# Patient Record
Sex: Male | Born: 1950 | Race: White | Hispanic: No | Marital: Married | Smoking: Former smoker
Health system: Southern US, Community
[De-identification: ages and names within clinical notes are randomized; demographics above are authoritative.]

## PROBLEM LIST (undated history)

## (undated) DIAGNOSIS — F32A Depression, unspecified: Secondary | ICD-10-CM

## (undated) DIAGNOSIS — R519 Headache, unspecified: Secondary | ICD-10-CM

## (undated) DIAGNOSIS — I10 Essential (primary) hypertension: Secondary | ICD-10-CM

## (undated) DIAGNOSIS — Z87442 Personal history of urinary calculi: Secondary | ICD-10-CM

## (undated) DIAGNOSIS — R51 Headache: Secondary | ICD-10-CM

## (undated) DIAGNOSIS — M1990 Unspecified osteoarthritis, unspecified site: Secondary | ICD-10-CM

## (undated) DIAGNOSIS — F329 Major depressive disorder, single episode, unspecified: Secondary | ICD-10-CM

## (undated) DIAGNOSIS — M779 Enthesopathy, unspecified: Secondary | ICD-10-CM

## (undated) DIAGNOSIS — E119 Type 2 diabetes mellitus without complications: Secondary | ICD-10-CM

## (undated) HISTORY — PX: HAND SURGERY: SHX662

## (undated) HISTORY — PX: WISDOM TOOTH EXTRACTION: SHX21

## (undated) HISTORY — PX: CYSTOSCOPY PROSTATE W/ LASER: SUR372

## (undated) HISTORY — PX: TOOTH EXTRACTION: SUR596

## (undated) HISTORY — PX: NECK SURGERY: SHX720

---

## 2011-02-12 HISTORY — PX: COLONOSCOPY: SHX174

## 2015-06-01 ENCOUNTER — Encounter: Payer: Self-pay | Admitting: *Deleted

## 2015-06-05 NOTE — Discharge Instructions (Signed)

## 2015-06-06 ENCOUNTER — Ambulatory Visit: Payer: Managed Care, Other (non HMO) | Admitting: Anesthesiology

## 2015-06-06 ENCOUNTER — Ambulatory Visit
Admission: RE | Admit: 2015-06-06 | Discharge: 2015-06-06 | Disposition: A | Discharge status: Home / Self Care | Payer: Managed Care, Other (non HMO) | Source: Ambulatory Visit | Attending: Gastroenterology | Admitting: Gastroenterology

## 2015-06-06 ENCOUNTER — Encounter
Admission: RE | Disposition: A | Discharge status: Home / Self Care | Payer: Self-pay | Source: Ambulatory Visit | Attending: Gastroenterology

## 2015-06-06 DIAGNOSIS — F329 Major depressive disorder, single episode, unspecified: Secondary | ICD-10-CM | POA: Insufficient documentation

## 2015-06-06 DIAGNOSIS — M1990 Unspecified osteoarthritis, unspecified site: Secondary | ICD-10-CM | POA: Diagnosis not present

## 2015-06-06 DIAGNOSIS — Z87891 Personal history of nicotine dependence: Secondary | ICD-10-CM | POA: Insufficient documentation

## 2015-06-06 DIAGNOSIS — I10 Essential (primary) hypertension: Secondary | ICD-10-CM | POA: Insufficient documentation

## 2015-06-06 DIAGNOSIS — D124 Benign neoplasm of descending colon: Secondary | ICD-10-CM | POA: Diagnosis not present

## 2015-06-06 DIAGNOSIS — E119 Type 2 diabetes mellitus without complications: Secondary | ICD-10-CM | POA: Diagnosis not present

## 2015-06-06 DIAGNOSIS — G43909 Migraine, unspecified, not intractable, without status migrainosus: Secondary | ICD-10-CM | POA: Diagnosis not present

## 2015-06-06 DIAGNOSIS — K5730 Diverticulosis of large intestine without perforation or abscess without bleeding: Secondary | ICD-10-CM | POA: Insufficient documentation

## 2015-06-06 DIAGNOSIS — Z886 Allergy status to analgesic agent status: Secondary | ICD-10-CM | POA: Insufficient documentation

## 2015-06-06 DIAGNOSIS — Z79899 Other long term (current) drug therapy: Secondary | ICD-10-CM | POA: Insufficient documentation

## 2015-06-06 DIAGNOSIS — D122 Benign neoplasm of ascending colon: Secondary | ICD-10-CM | POA: Insufficient documentation

## 2015-06-06 DIAGNOSIS — Z7984 Long term (current) use of oral hypoglycemic drugs: Secondary | ICD-10-CM | POA: Diagnosis not present

## 2015-06-06 DIAGNOSIS — Z1211 Encounter for screening for malignant neoplasm of colon: Secondary | ICD-10-CM | POA: Diagnosis present

## 2015-06-06 HISTORY — DX: Headache: R51

## 2015-06-06 HISTORY — DX: Essential (primary) hypertension: I10

## 2015-06-06 HISTORY — DX: Type 2 diabetes mellitus without complications: E11.9

## 2015-06-06 HISTORY — PX: POLYPECTOMY: SHX5525

## 2015-06-06 HISTORY — DX: Enthesopathy, unspecified: M77.9

## 2015-06-06 HISTORY — DX: Major depressive disorder, single episode, unspecified: F32.9

## 2015-06-06 HISTORY — DX: Headache, unspecified: R51.9

## 2015-06-06 HISTORY — PX: COLONOSCOPY: SHX5424

## 2015-06-06 HISTORY — DX: Depression, unspecified: F32.A

## 2015-06-06 HISTORY — DX: Unspecified osteoarthritis, unspecified site: M19.90

## 2015-06-06 HISTORY — DX: Personal history of urinary calculi: Z87.442

## 2015-06-06 LAB — GLUCOSE, CAPILLARY
GLUCOSE-CAPILLARY: 109 mg/dL — AB (ref 65–99)
Glucose-Capillary: 101 mg/dL — ABNORMAL HIGH (ref 65–99)

## 2015-06-06 SURGERY — COLONOSCOPY
Anesthesia: Monitor Anesthesia Care | Wound class: Contaminated

## 2015-06-06 MED ORDER — STERILE WATER FOR IRRIGATION IR SOLN
Status: DC | PRN
  Administered 2015-06-06: 11:00:00

## 2015-06-06 MED ORDER — LACTATED RINGERS IV SOLN
INTRAVENOUS | Status: DC
  Administered 2015-06-06: 09:00:00 via INTRAVENOUS

## 2015-06-06 MED ORDER — PROPOFOL 10 MG/ML IV BOLUS
INTRAVENOUS | Status: DC | PRN
  Administered 2015-06-06: 20 mg via INTRAVENOUS
  Administered 2015-06-06: 10 mg via INTRAVENOUS
  Administered 2015-06-06: 30 mg via INTRAVENOUS
  Administered 2015-06-06: 20 mg via INTRAVENOUS
  Administered 2015-06-06 (×2): 30 mg via INTRAVENOUS
  Administered 2015-06-06: 20 mg via INTRAVENOUS
  Administered 2015-06-06: 70 mg via INTRAVENOUS

## 2015-06-06 MED ORDER — LIDOCAINE HCL (CARDIAC) 20 MG/ML IV SOLN
INTRAVENOUS | Status: DC | PRN
  Administered 2015-06-06: 40 mg via INTRAVENOUS

## 2015-06-06 SURGICAL SUPPLY — 30 items
CANISTER SUCT 1200ML W/VALVE (MISCELLANEOUS) ×4 IMPLANT
FCP ESCP3.2XJMB 240X2.8X (MISCELLANEOUS)
FORCEPS BIOP RAD 4 LRG CAP 4 (CUTTING FORCEPS) ×4 IMPLANT
FORCEPS BIOP RJ4 240 W/NDL (MISCELLANEOUS)
FORCEPS ESCP3.2XJMB 240X2.8X (MISCELLANEOUS) IMPLANT
GOWN CVR UNV OPN BCK APRN NK (MISCELLANEOUS) ×2 IMPLANT
GOWN ISOL THUMB LOOP REG UNIV (MISCELLANEOUS) ×2
GOWN STRL REUS W/ TWL LRG LVL3 (GOWN DISPOSABLE) ×2 IMPLANT
GOWN STRL REUS W/TWL LRG LVL3 (GOWN DISPOSABLE) ×2
HEMOCLIP INSTINCT (CLIP) IMPLANT
INJECTOR VARIJECT VIN23 (MISCELLANEOUS) IMPLANT
KIT CO2 TUBING (TUBING) IMPLANT
KIT DEFENDO VALVE AND CONN (KITS) IMPLANT
KIT ENDO PROCEDURE OLY (KITS) ×4 IMPLANT
LIGATOR MULTIBAND 6SHOOTER MBL (MISCELLANEOUS) IMPLANT
MARKER SPOT ENDO TATTOO 5ML (MISCELLANEOUS) IMPLANT
PAD GROUND ADULT SPLIT (MISCELLANEOUS) IMPLANT
SNARE SHORT THROW 13M SML OVAL (MISCELLANEOUS) IMPLANT
SNARE SHORT THROW 30M LRG OVAL (MISCELLANEOUS) IMPLANT
SPOT EX ENDOSCOPIC TATTOO (MISCELLANEOUS)
SUCTION POLY TRAP 4CHAMBER (MISCELLANEOUS) IMPLANT
TRAP SUCTION POLY (MISCELLANEOUS) IMPLANT
TUBING CONN 6MMX3.1M (TUBING)
TUBING SUCTION CONN 0.25 STRL (TUBING) IMPLANT
UNDERPAD 30X60 958B10 (PK) (MISCELLANEOUS) IMPLANT
VALVE BIOPSY ENDO (VALVE) IMPLANT
VARIJECT INJECTOR VIN23 (MISCELLANEOUS)
WATER AUXILLARY (MISCELLANEOUS) IMPLANT
WATER STERILE IRR 250ML POUR (IV SOLUTION) IMPLANT
WATER STERILE IRR 500ML POUR (IV SOLUTION) IMPLANT

## 2015-06-06 NOTE — H&P (Signed)
Primary Care Physician:  No primary care provider on file. Primary Gastroenterologist:  Dr. Candace Cruise  Pre-Procedure History & Physical: HPI:  Jonathan Oneal is a 65 y.o. male is here for an colonoscopy.   Past Medical History  Diagnosis Date  . Hypertension   . Depression   . Diabetes mellitus without complication ([place])   . Headache     migraine, tension  . Arthritis     hands, shoulders, neck  . Tendonitis     shoulders, elbows  . History of kidney stones     Past Surgical History  Procedure Laterality Date  . Neck surgery      fusion C-4,5,6 or C-5,6,7   . Hand surgery Right   . Cystoscopy prostate w/ laser    . Wisdom tooth extraction    . Colonoscopy  2013    Prior to Admission medications   Medication Sig Start Date End Date Taking? Authorizing Provider  acyclovir (ZOVIRAX) 400 MG tablet Take 400 mg by mouth 2 (two) times daily.   Yes Historical Provider, MD  allopurinol (ZYLOPRIM) 300 MG tablet Take 300 mg by mouth daily.   Yes Historical Provider, MD  atorvastatin (LIPITOR) 20 MG tablet Take 20 mg by mouth daily.   Yes Historical Provider, MD  butalbital-acetaminophen-caffeine (FIORICET, ESGIC) 50-325-40 MG tablet Take by mouth as needed for headache.   Yes Historical Provider, MD  cyanocobalamin 1000 MCG tablet Take 1,000 mcg by mouth daily.   Yes Historical Provider, MD  ELDERBERRY PO Take by mouth daily.   Yes Historical Provider, MD  ergocalciferol (VITAMIN D2) 50000 units capsule Take 50,000 Units by mouth once a week.   Yes Historical Provider, MD  metFORMIN (GLUCOPHAGE) 500 MG tablet Take 500 mg by mouth 2 (two) times daily with a meal.   Yes Historical Provider, MD  metoprolol succinate (TOPROL-XL) 50 MG 24 hr tablet Take 50 mg by mouth daily. Take with or immediately following a meal.   Yes Historical Provider, MD  olmesartan (BENICAR) 40 MG tablet Take 40 mg by mouth daily.   Yes Historical Provider, MD  Prenat w/o A-FE-Methf-FA-Omega (PNV-OMEGA PO) Take by  mouth daily.   Yes Historical Provider, MD  Vilazodone HCl (VIIBRYD) 20 MG TABS Take by mouth daily.   Yes Historical Provider, MD    Allergies as of 06/01/2015 - Review Complete 06/01/2015  Allergen Reaction Noted  . Nsaids  06/01/2015    History reviewed. No pertinent family history.  Social History   Social History  . Marital Status: Married    Spouse Name: N/A  . Number of Children: N/A  . Years of Education: N/A   Occupational History  . Not on file.   Social History Main Topics  . Smoking status: Former Research scientist (life sciences)  . Smokeless tobacco: Not on file     Comment: quit 1970's  . Alcohol Use: Yes     Comment: rare - 1 drink/mo  . Drug Use: Not on file  . Sexual Activity: Not on file   Other Topics Concern  . Not on file   Social History Narrative    Review of Systems: See HPI, otherwise negative ROS  Physical Exam: BP 154/82 mmHg  Pulse 77  Temp(Src) 97.7 F (36.5 C) (Temporal)  Resp 16  Ht 5\' 5"  (1.651 m)  Wt 173 lb (78.472 kg)  BMI 28.79 kg/m2  SpO2 100% General:   Alert,  pleasant and cooperative in NAD Head:  Normocephalic and atraumatic. Neck:  Supple; no  masses or thyromegaly. Lungs:  Clear throughout to auscultation.    Heart:  Regular rate and rhythm. Abdomen:  Soft, nontender and nondistended. Normal bowel sounds, without guarding, and without rebound.   Neurologic:  Alert and  oriented x4;  grossly normal neurologically.  Impression/Plan: Jhared Bizon is here for an colonoscopy to be performed for personal hx of colon polyps  Risks, benefits, limitations, and alternatives regarding  colonoscopy have been reviewed with the patient.  Questions have been answered.  All parties agreeable.   Sundi Slevin, Lupita Dawn, MD  06/06/2015, 10:04 AM

## 2015-06-06 NOTE — Anesthesia Procedure Notes (Signed)
Procedure Name: MAC Performed by: Gwendalynn Eckstrom Pre-anesthesia Checklist: Patient identified, Emergency Drugs available, Suction available, Timeout performed and Patient being monitored Patient Re-evaluated:Patient Re-evaluated prior to inductionOxygen Delivery Method: Nasal cannula Placement Confirmation: positive ETCO2       

## 2015-06-06 NOTE — Anesthesia Postprocedure Evaluation (Signed)
Anesthesia Post Note  Patient: Jonathan Oneal  Procedure(s) Performed: Procedure(s) (LRB): COLONOSCOPY (N/A) POLYPECTOMY  Patient location during evaluation: PACU Anesthesia Type: MAC Level of consciousness: awake and alert Pain management: pain level controlled Vital Signs Assessment: post-procedure vital signs reviewed and stable Respiratory status: spontaneous breathing, nonlabored ventilation, respiratory function stable and patient connected to nasal cannula oxygen Cardiovascular status: stable and blood pressure returned to baseline Anesthetic complications: no    Keegan Ducey

## 2015-06-06 NOTE — Anesthesia Preprocedure Evaluation (Signed)
Anesthesia Evaluation  Patient identified by MRN, date of birth, ID band  Reviewed: NPO status   History of Anesthesia Complications Negative for: history of anesthetic complications  Airway Mallampati: II  TM Distance: >3 FB Neck ROM: full    Dental no notable dental hx.    Pulmonary neg pulmonary ROS, former smoker,    Pulmonary exam normal        Cardiovascular Exercise Tolerance: Good hypertension, Normal cardiovascular exam  lipids   Neuro/Psych  Headaches, Anxiety Depression    GI/Hepatic negative GI ROS, Neg liver ROS,   Endo/Other  diabetes  Renal/GU negative Renal ROS  negative genitourinary   Musculoskeletal  (+) Arthritis ,   Abdominal   Peds  Hematology negative hematology ROS (+)   Anesthesia Other Findings R hand corrective surgery.  Reproductive/Obstetrics                             Anesthesia Physical Anesthesia Plan  ASA: II  Anesthesia Plan: MAC   Post-op Pain Management:    Induction:   Airway Management Planned:   Additional Equipment:   Intra-op Plan:   Post-operative Plan:   Informed Consent: I have reviewed the patients History and Physical, chart, labs and discussed the procedure including the risks, benefits and alternatives for the proposed anesthesia with the patient or authorized representative who has indicated his/her understanding and acceptance.     Plan Discussed with: CRNA  Anesthesia Plan Comments:         Anesthesia Quick Evaluation

## 2015-06-06 NOTE — Transfer of Care (Signed)
Immediate Anesthesia Transfer of Care Note  Patient: Jonathan Oneal  Procedure(s) Performed: Procedure(s) with comments: COLONOSCOPY (N/A) - DIABETIC ORAL POLYPECTOMY  Patient Location: PACU  Anesthesia Type: MAC  Level of Consciousness: awake, alert  and patient cooperative  Airway and Oxygen Therapy: Patient Spontanous Breathing and Patient connected to supplemental oxygen  Post-op Assessment: Post-op Vital signs reviewed, Patient's Cardiovascular Status Stable, Respiratory Function Stable, Patent Airway and No signs of Nausea or vomiting  Post-op Vital Signs: Reviewed and stable  Complications: No apparent anesthesia complications

## 2015-06-06 NOTE — Op Note (Signed)
[facility] Gastroenterology Patient Name: Jonathan Oneal Procedure Date: 06/06/2015 10:07 AM MRN: [id number] Account #: [id number] Date of Birth: 1950-12-13 Admit Type: Outpatient Age: 65 Room: [facility] OR ROOM 01 Gender: Male Note Status: Finalized Procedure:            Colonoscopy Indications:          Personal history of colonic polyps Providers:            Lupita Dawn. Candace Cruise, MD Referring MD:         Sofie Hartigan (Referring MD) Medicines:            Monitored Anesthesia Care Complications:        No immediate complications. Procedure:            Pre-Anesthesia Assessment:                       - Prior to the procedure, a History and Physical was                        performed, and patient medications, allergies and                        sensitivities were reviewed. The patient's tolerance of                        previous anesthesia was reviewed.                       - The risks and benefits of the procedure and the                        sedation options and risks were discussed with the                        patient. All questions were answered and informed                        consent was obtained.                       - After reviewing the risks and benefits, the patient                        was deemed in satisfactory condition to undergo the                        procedure.                       After obtaining informed consent, the colonoscope was                        passed under direct vision. Throughout the procedure,                        the patient's blood pressure, pulse, and oxygen                        saturations were monitored continuously. The was  introduced through the anus and advanced to the the                        cecum, identified by appendiceal orifice and ileocecal                        valve. The colonoscopy was performed without                        difficulty. The patient tolerated the  procedure well.                        The quality of the bowel preparation was good. Findings:      A small polyp was found in the ascending colon. The polyp was sessile.       The polyp was removed with a jumbo cold forceps. Resection and retrieval       were complete.      Two sessile polyps were found in the descending colon. The polyps were       small in size. These polyps were removed with a jumbo cold forceps.       Resection and retrieval were complete.      Multiple small and large-mouthed diverticula were found in the sigmoid       colon.      The exam was otherwise without abnormality. Impression:           - One small polyp in the ascending colon, removed with                        a jumbo cold forceps. Resected and retrieved.                       - Two small polyps in the descending colon, removed                        with a jumbo cold forceps. Resected and retrieved.                       - Diverticulosis in the sigmoid colon.                       - The examination was otherwise normal. Recommendation:       - Discharge patient to home.                       - Await pathology results.                       - Repeat colonoscopy in 5 years for surveillance based                        on pathology results.                       - The findings and recommendations were discussed with                        the patient. Procedure Code(s):    --- Professional ---  45380, Colonoscopy, flexible; with biopsy, single or                        multiple Diagnosis Code(s):    --- Professional ---                       D12.2, Benign neoplasm of ascending colon                       D12.4, Benign neoplasm of descending colon                       Z86.010, Personal history of colonic polyps                       K57.30, Diverticulosis of large intestine without                        perforation or abscess without bleeding CPT copyright 2016 American Medical  Association. All rights reserved. The codes documented in this report are preliminary and upon coder review may  be revised to meet current compliance requirements. Hulen Luster, MD 06/06/2015 10:50:35 AM This report has been signed electronically. Number of Addenda: 0 Note Initiated On: 06/06/2015 10:07 AM Scope Withdrawal Time: 0 hours 15 minutes 20 seconds  Total Procedure Duration: 0 hours 20 minutes 31 seconds       [facility]

## 2015-06-07 ENCOUNTER — Encounter: Payer: Self-pay | Admitting: Gastroenterology

## 2015-06-08 LAB — SURGICAL PATHOLOGY

## 2019-02-14 ENCOUNTER — Ambulatory Visit
Admission: EM | Admit: 2019-02-14 | Discharge: 2019-02-14 | Disposition: A | Discharge status: Home / Self Care | Payer: Medicare Other | Attending: Family Medicine | Admitting: Family Medicine

## 2019-02-14 ENCOUNTER — Other Ambulatory Visit: Payer: Self-pay

## 2019-02-14 DIAGNOSIS — M62830 Muscle spasm of back: Secondary | ICD-10-CM | POA: Diagnosis not present

## 2019-02-14 DIAGNOSIS — R030 Elevated blood-pressure reading, without diagnosis of hypertension: Secondary | ICD-10-CM | POA: Diagnosis not present

## 2019-02-14 DIAGNOSIS — Z8639 Personal history of other endocrine, nutritional and metabolic disease: Secondary | ICD-10-CM | POA: Diagnosis not present

## 2019-02-14 DIAGNOSIS — M5442 Lumbago with sciatica, left side: Secondary | ICD-10-CM | POA: Diagnosis not present

## 2019-02-14 DIAGNOSIS — I10 Essential (primary) hypertension: Secondary | ICD-10-CM

## 2019-02-14 MED ORDER — CYCLOBENZAPRINE HCL 10 MG PO TABS: 10.0000 mg | ORAL_TABLET | Freq: Three times a day (TID) | ORAL | 0 refills | Status: DC | PRN

## 2019-02-14 MED ORDER — METHYLPREDNISOLONE SODIUM SUCC 125 MG IJ SOLR
125.0000 mg | Freq: Once | INTRAMUSCULAR | Status: AC
  Administered 2019-02-14: 125 mg via INTRAMUSCULAR

## 2019-02-14 NOTE — ED Triage Notes (Signed)
Pt presents with left-sided LBP radiating into his leg and foot. Pt states the pain started this past Friday and is getting progressively worse. He now has pain with ambulation and standing. He states he was on a stepstool last Monday and started to loose his balance, he did jump off the stool and stumbled before regaining balance. He did not actually fall. He does think he has had previous lumbar and hip issues on the same side.

## 2019-02-14 NOTE — Discharge Instructions (Addendum)
You were given a shot of SoluMedrol 125mg  today to help with pain and inflammation. May take Flexeril 10mg  muscle relaxer- 1 tablet every 8 hours as needed. Apply warm compresses to area for comfort. May need Orthopedic or Spine Specialist evaluation if symptoms persist. Follow-up in 3 to 4 days if not improving.

## 2019-02-15 NOTE — ED Provider Notes (Signed)
[facility]    CSN: [id number] Arrival date & time: 02/14/19  1251      History   Chief Complaint No chief complaint on file.   HPI Jonathan Oneal is a 69 y.o. male.   69 year old male presents with left lower back pain radiating to his left hip and upper thigh with occasional radiation to his left lower leg and foot. Has history of occasional back pain but this pain is now more steady and getting worse over the past 3 days. Pain is often a burning pain when radiating with a constant dull pain. Changing positions is painful as well as sitting or lying flat. Tends to have some improvement of pain with partial extension. Has been unable to sleep due to the pain the past few nights. No distinct injury to area but does recall stepping off a stool about 1 week ago in which he lost his balance and may have twisted his back. He did not fall and did not experience any immediate pain. He is unable to take NSAIDs due to history of bleeding (nose) although he did try a Naproxen with no relief. Other chronic health issues include HTN, hyperlipidemia, tension headaches, type 2 DM, recurrent kidney stones and upper body arthritis. He did have a cervical fusion in the past. No recent contact with spinal surgeon. Currently on Toprol XL, Benicar, Lipitor, Metformin, Allopurinol (for decreasing uric acid to prevent kidney stones), Acyclovir and multiple supplements daily as well as Fioricet prn for headaches.   The history is provided by the patient.    Past Medical History:  Diagnosis Date  . Arthritis    hands, shoulders, neck  . Depression   . Diabetes mellitus without complication ([place])   . Headache    migraine, tension  . History of kidney stones   . Hypertension   . Tendonitis    shoulders, elbows    There are no problems to display for this patient.   Past Surgical History:  Procedure Laterality Date  . COLONOSCOPY  2013  . COLONOSCOPY N/A 06/06/2015   Procedure: COLONOSCOPY;   Surgeon: Hulen Luster, MD;  Location: [place];  Service: Gastroenterology;  Laterality: N/A;  DIABETIC ORAL  . CYSTOSCOPY PROSTATE W/ LASER    . HAND SURGERY Right   . NECK SURGERY     fusion C-4,5,6 or C-5,6,7   . POLYPECTOMY  06/06/2015   Procedure: POLYPECTOMY;  Surgeon: Hulen Luster, MD;  Location: [place];  Service: Gastroenterology;;  . Arnetha Courser TOOTH EXTRACTION         Home Medications    Prior to Admission medications   Medication Sig Start Date End Date Taking? Authorizing Provider  Multiple Vitamin (MULTIVITAMIN) tablet Take 1 tablet by mouth daily.   Yes [provider]  acyclovir (ZOVIRAX) 400 MG tablet Take 400 mg by mouth 2 (two) times daily.    [provider]  allopurinol (ZYLOPRIM) 300 MG tablet Take 300 mg by mouth daily.    [provider]  atorvastatin (LIPITOR) 20 MG tablet Take 20 mg by mouth daily.    [provider]  butalbital-acetaminophen-caffeine (FIORICET, ESGIC) 50-325-40 MG tablet Take by mouth as needed for headache.    [provider]  cyanocobalamin 1000 MCG tablet Take 1,000 mcg by mouth daily.    [provider]  cyclobenzaprine (FLEXERIL) 10 MG tablet Take 1 tablet (10 mg total) by mouth 3 (three) times daily as needed for muscle spasms. 02/14/19  Katy Apo, NP  ELDERBERRY PO Take by mouth daily.    [provider]  ergocalciferol (VITAMIN D2) 50000 units capsule Take 50,000 Units by mouth once a week.    [provider]  metFORMIN (GLUCOPHAGE) 500 MG tablet Take 500 mg by mouth 2 (two) times daily with a meal.    [provider]  metoprolol succinate (TOPROL-XL) 50 MG 24 hr tablet Take 50 mg by mouth daily. Take with or immediately following a meal.    [provider]  olmesartan (BENICAR) 40 MG tablet Take 40 mg by mouth daily.    [provider]  Vilazodone HCl (VIIBRYD) 20 MG TABS Take by mouth daily.  02/14/19  [provider]    Family History History reviewed. No pertinent family history.  Social History Social History   Tobacco Use  . Smoking status: Former Smoker    Types: Cigarettes  . Smokeless tobacco: Never Used  . Tobacco comment: quit 1970's  Substance Use Topics  . Alcohol use: Yes    Comment: rare - 1 drink/mo  . Drug use: Not on file     Allergies   Nsaids   Review of Systems Review of Systems  Constitutional: Negative for appetite change, chills, fatigue and fever.  HENT: Positive for nosebleeds (in past with NSAID use).   Eyes: Negative for photophobia and visual disturbance.  Respiratory: Negative for cough, chest tightness, shortness of breath and wheezing.   Cardiovascular: Negative for chest pain, palpitations and leg swelling.  Gastrointestinal: Negative for nausea and vomiting.  Genitourinary: Negative for decreased urine volume, difficulty urinating, dysuria, enuresis, flank pain, frequency, hematuria, penile pain, scrotal swelling and testicular pain.  Musculoskeletal: Positive for arthralgias, back pain, myalgias and neck pain (chronic but improved after cervical fusion). Negative for gait problem and neck stiffness.  Skin: Negative for color change, rash and wound.  Neurological: Positive for headaches. Negative for dizziness, tremors, seizures, syncope, facial asymmetry, speech difficulty, weakness, light-headedness and numbness.  Hematological: Negative for adenopathy. Does not bruise/bleed easily.  Psychiatric/Behavioral: Negative.      Physical Exam Triage Vital Signs ED Triage Vitals  Enc Vitals Group     BP 02/14/19 1312 (!) 155/115     Pulse Rate 02/14/19 1312 (!) 105     Resp --      Temp 02/14/19 1312 98.6 F (37 C)     Temp Source 02/14/19 1312 Oral     SpO2 02/14/19 1312 100 %     Weight 02/14/19 1309 170 lb (77.1 kg)     Height 02/14/19 1309 5\' 5"  (1.651 m)     Head Circumference --      Peak Flow --      Pain Score 02/14/19 1308  10     Pain Loc --      Pain Edu? --      Excl. in [place]? --    No data found.  Updated Vital Signs BP (!) 155/115 (BP Location: Left Arm)   Pulse (!) 105   Temp 98.6 F (37 C) (Oral)   Ht 5\' 5"  (1.651 m)   Wt 170 lb (77.1 kg)   SpO2 100%   BMI 28.29 kg/m   Visual Acuity Right Eye Distance:   Left Eye Distance:   Bilateral Distance:    Right Eye Near:   Left Eye Near:    Bilateral Near:     Physical Exam Vitals and nursing note reviewed.  Constitutional:  General: He is awake. He is not in acute distress.    Appearance: He is well-developed and well-groomed.     Comments: Patient is sitting with back in partial extension in exam chair and is less pain in this position but severe increase in pain with any change of position.   HENT:     Head: Normocephalic and atraumatic.     Right Ear: Hearing and external ear normal.     Left Ear: Hearing and external ear normal.  Eyes:     Extraocular Movements: Extraocular movements intact.     Conjunctiva/sclera: Conjunctivae normal.  Cardiovascular:     Rate and Rhythm: Regular rhythm. Tachycardia present.     Heart sounds: Normal heart sounds. No murmur.  Pulmonary:     Effort: Pulmonary effort is normal. No respiratory distress.     Breath sounds: Normal breath sounds and air entry. No decreased air movement. No decreased breath sounds, wheezing, rhonchi or rales.     Comments: Respiratory rate = 17 Abdominal:     General: Abdomen is flat. Bowel sounds are normal. There is no distension.     Tenderness: There is no abdominal tenderness.  Musculoskeletal:        General: Tenderness present. No swelling, deformity or signs of injury.     Cervical back: No rigidity. Normal range of motion.     Thoracic back: Normal.     Lumbar back: Spasms and tenderness present. No swelling, edema, deformity, signs of trauma or bony tenderness. Decreased range of motion. Positive right straight leg raise test and positive left straight leg  raise test. No scoliosis.       Back:     Right lower leg: No edema.     Left lower leg: No edema.       Legs:     Comments: Has decreased range of motion, particularly with rotation and flexion. Most comfortable in a partial extension position. Occasional muscle spasms present in left lower lumbar muscle area. Slightly tender to palpation of muscle. No tenderness over spinal processes. No distinct neuro deficits noted. Good distal pulses and sensation.   Has full range of motion of left hip with pain only with movement of stretching posterior hip muscles.   Lymphadenopathy:     Cervical: No cervical adenopathy.  Skin:    General: Skin is warm and dry.     Capillary Refill: Capillary refill takes less than 2 seconds.     Findings: No rash.  Neurological:     General: No focal deficit present.     Mental Status: He is alert and oriented to person, place, and time.     Sensory: Sensation is intact. No sensory deficit.     Motor: Motor function is intact.     Coordination: Coordination is intact.     Gait: Gait is intact.     Deep Tendon Reflexes: Reflexes are normal and symmetric.     Comments: Able to ambulate but with pain.   Psychiatric:        Attention and Perception: Attention and perception normal.        Mood and Affect: Mood and affect normal.        Speech: Speech normal.        Behavior: Behavior normal. Behavior is cooperative.        Thought Content: Thought content normal.        Cognition and Memory: Cognition normal.        Judgment: Judgment normal.  UC Treatments / Results  Labs (all labs ordered are listed, but only abnormal results are displayed) Labs Reviewed - No data to display  EKG   Radiology No results found.  Procedures Procedures (including critical care time)  Medications Ordered in UC Medications  methylPREDNISolone sodium succinate (SOLU-MEDROL) 125 mg/2 mL injection 125 mg (125 mg Intramuscular Given 02/14/19 1516)    Initial  Impression / Assessment and Plan / UC Course  I have reviewed the triage vital signs and the nursing notes.  Pertinent labs & imaging results that were available during my care of the patient were reviewed by me and considered in my medical decision making (see chart for details).    Discussed history and clinical findings with patient- do not feel that imaging is needed at this time but may need to consider if symptoms persist. Discussed that he appears to have a lower lumbar muscle strain with nerve irritation. Do not feel that hip etiology is involved.  Discussed various treatment options- patient indicated sugar levels are more controlled and are in the lower 100's. Discussed that blood pressure elevated probably due to pain. Since risk of nose bleeds is higher with oral NSAIDs, recommend other treatment. Patient does not want any controlled medications. Since in significant pain, discussed with patient that benefits may outweigh the risks with an IM steroid. Patient agrees to treatment with SoluMedrol 125mg  IM now. Continue to monitor sugar levels and BP. May take Flexeril 10mg  every 8 hours as needed for pain and muscle spasms. Recommend apply warm compresses to back area for comfort. May need Orthopedic or Spine Specialist evaluation if symptoms persist. If pain remains severe or any loss of bladder or bowel control, go to the ER ASAP. Otherwise, follow-up in 3 to 4 days if not improving.   Final Clinical Impressions(s) / UC Diagnoses   Final diagnoses:  Acute left-sided low back pain with left-sided sciatica  Elevated blood pressure reading with diagnosis of hypertension  History of hyperglycemia  Lumbar paraspinal muscle spasm     Discharge Instructions     You were given a shot of SoluMedrol 125mg  today to help with pain and inflammation. May take Flexeril 10mg  muscle relaxer- 1 tablet every 8 hours as needed. Apply warm compresses to area for comfort. May need Orthopedic or Spine  Specialist evaluation if symptoms persist. Follow-up in 3 to 4 days if not improving.     ED Prescriptions    Medication Sig Dispense Auth. Provider   cyclobenzaprine (FLEXERIL) 10 MG tablet Take 1 tablet (10 mg total) by mouth 3 (three) times daily as needed for muscle spasms. 21 tablet Ermine Spofford, Nicholes Stairs, NP     PDMP not reviewed this encounter.   Katy Apo, NP 02/15/19 1330

## 2019-03-01 ENCOUNTER — Other Ambulatory Visit: Payer: Self-pay

## 2019-03-01 ENCOUNTER — Other Ambulatory Visit: Payer: Self-pay | Admitting: Physical Medicine and Rehabilitation

## 2019-03-01 ENCOUNTER — Ambulatory Visit
Admission: RE | Admit: 2019-03-01 | Discharge: 2019-03-01 | Disposition: A | Discharge status: Home / Self Care | Payer: Medicare Other | Source: Ambulatory Visit | Attending: Physical Medicine and Rehabilitation | Admitting: Physical Medicine and Rehabilitation

## 2019-03-01 DIAGNOSIS — M5416 Radiculopathy, lumbar region: Secondary | ICD-10-CM | POA: Diagnosis present

## 2019-04-06 ENCOUNTER — Other Ambulatory Visit: Payer: Self-pay | Admitting: Neurosurgery

## 2019-04-13 ENCOUNTER — Encounter
Admission: RE | Admit: 2019-04-13 | Discharge: 2019-04-13 | Disposition: A | Discharge status: Home / Self Care | Payer: Medicare Other | Source: Ambulatory Visit | Attending: Neurosurgery | Admitting: Neurosurgery

## 2019-04-13 ENCOUNTER — Other Ambulatory Visit: Payer: Self-pay

## 2019-04-13 NOTE — Patient Instructions (Signed)
Your procedure is scheduled on: Mon 3/8 Report to Day Surgery. [facility] To find out your arrival time please call [phone] between 1PM - 3PM on  Friday 3/5  Remember: Instructions that are not followed completely may result in serious medical risk,  up to and including death, or upon the discretion of your surgeon and anesthesiologist your  surgery may need to be rescheduled.     _X__ 1. Do not eat food after midnight the night before your procedure.                 No gum chewing or hard candies. You may drink clear liquids up to 2 hours                 before you are scheduled to arrive for your surgery- DO not drink clear                 liquids within 2 hours of the start of your surgery.                 Clear Liquids include:  water,  Gatorade, G2 or                  Gatorade Zero (avoid Red/Purple/Blue), Black Coffee or Tea (Do not add                 anything to coffee or tea). _____2.   Complete the carbohydrate drink provided to you, 2 hours before arrival.  __X__2.  On the morning of surgery brush your teeth with toothpaste and water, you                may rinse your mouth with mouthwash if you wish.  Do not swallow any toothpaste of mouthwash.     _X__ 3.  No Alcohol for 24 hours before or after surgery.   ___ 4.  Do Not Smoke or use e-cigarettes For 24 Hours Prior to Your Surgery.                 Do not use any chewable tobacco products for at least 6 hours prior to                 surgery.  ____  5.  Bring all medications with you on the day of surgery if instructed.   _x___  6.  Notify your doctor if there is any change in your medical condition      (cold, fever, infections).     Do not wear jewelry,  Do not wear lotions. You may wear deodorant. Do not shave 48 hours prior to surgery. Men may shave face and neck. Do not bring valuables to the hospital.    [facility] is not responsible for any belongings or valuables.  Contacts,  dentures or bridgework may not be worn into surgery. Leave your suitcase in the car. After surgery it may be brought to your room. For patients admitted to the hospital, discharge time is determined by your treatment team.   Patients discharged the day of surgery will not be allowed to drive home.   Make arrangements for someone to be with you for the first 24 hours of your Same Day Discharge.    Please read over the following fact sheets that you were given:   __x__ Take these medicines the morning of surgery with A SIP OF WATER:    1. acyclovir (ZOVIRAX) 400 MG tablet  2. allopurinol (ZYLOPRIM)  300 MG tablet  3. gabapentin (NEURONTIN) 300 MG capsule  4.cyclobenzaprine (FLEXERIL) 10 MG tablet if needed  5.metoprolol succinate (TOPROL-XL) 50 MG 24 hr tablet  6.  ____ Fleet Enema (as directed)   _x___ Use CHG Soap (or wipes) as directed  ____ Use Benzoyl Peroxide Gel as instructed  ____ Use inhalers on the day of surgery  __x__ Stop metformin 2 days prior to surgery last dose evening of Friday 3/5    ____ Take 1/2 of usual insulin dose the night before surgery. No insulin the morning          of surgery.   ____ Stop Coumadin/Plavix/aspirin on  __x__ Stop Anti-inflammatories no ibuprofen aleve or aspirin ( takes rarely)  May take tylenol   __x__ Stop supplements until after surgery. CINNAMON PO, ELDERBERRY PO,TURMERIC PO   ____ Bring C-Pap to the hospital.

## 2019-04-15 ENCOUNTER — Encounter
Admission: RE | Admit: 2019-04-15 | Discharge: 2019-04-15 | Disposition: A | Discharge status: Home / Self Care | Payer: Medicare Other | Source: Ambulatory Visit | Attending: Neurosurgery | Admitting: Neurosurgery

## 2019-04-15 ENCOUNTER — Other Ambulatory Visit: Payer: Medicare Other

## 2019-04-15 ENCOUNTER — Other Ambulatory Visit: Payer: Self-pay

## 2019-04-15 DIAGNOSIS — Z01818 Encounter for other preprocedural examination: Secondary | ICD-10-CM | POA: Diagnosis not present

## 2019-04-15 DIAGNOSIS — Z20822 Contact with and (suspected) exposure to covid-19: Secondary | ICD-10-CM | POA: Insufficient documentation

## 2019-04-15 DIAGNOSIS — I10 Essential (primary) hypertension: Secondary | ICD-10-CM | POA: Insufficient documentation

## 2019-04-15 LAB — CBC
HCT: 44.1 % (ref 39.0–52.0)
Hemoglobin: 14.5 g/dL (ref 13.0–17.0)
MCH: 32 pg (ref 26.0–34.0)
MCHC: 32.9 g/dL (ref 30.0–36.0)
MCV: 97.4 fL (ref 80.0–100.0)
Platelets: 299 10*3/uL (ref 150–400)
RBC: 4.53 MIL/uL (ref 4.22–5.81)
RDW: 12.8 % (ref 11.5–15.5)
WBC: 5.9 10*3/uL (ref 4.0–10.5)
nRBC: 0 % (ref 0.0–0.2)

## 2019-04-15 LAB — BASIC METABOLIC PANEL
Anion gap: 10 (ref 5–15)
BUN: 19 mg/dL (ref 8–23)
CO2: 25 mmol/L (ref 22–32)
Calcium: 9.1 mg/dL (ref 8.9–10.3)
Chloride: 103 mmol/L (ref 98–111)
Creatinine, Ser: 1.01 mg/dL (ref 0.61–1.24)
GFR calc Af Amer: >60 mL/min (ref >60)
GFR calc non Af Amer: >60 mL/min (ref >60)
Glucose, Bld: 119 mg/dL — ABNORMAL HIGH (ref 70–99)
Potassium: 4.1 mmol/L (ref 3.5–5.1)
Sodium: 138 mmol/L (ref 135–145)

## 2019-04-15 LAB — APTT: aPTT: 31 seconds (ref 24–36)

## 2019-04-15 LAB — PROTIME-INR
INR: 1 (ref 0.8–1.2)
Prothrombin Time: 13 seconds (ref 11.4–15.2)

## 2019-04-15 LAB — SURGICAL PCR SCREEN
MRSA, PCR: NEGATIVE
Staphylococcus aureus: POSITIVE — AB

## 2019-04-15 LAB — SARS CORONAVIRUS 2 (TAT 6-24 HRS): SARS Coronavirus 2: NEGATIVE

## 2019-04-16 LAB — TYPE AND SCREEN
ABO/RH(D): A POS
Antibody Screen: NEGATIVE

## 2019-04-16 NOTE — Progress Notes (Signed)
Brief Pharmacy Note  Pharmacy consulted for cefazolin for surgical prophylaxis. Order for cefazolin 2 g IV x 1 to be given within 60 minutes of surgical incision. Nurse to release order when appropriate.  Dorena Bodo, PharmD

## 2019-04-19 ENCOUNTER — Ambulatory Visit: Payer: Medicare Other | Admitting: Anesthesiology

## 2019-04-19 ENCOUNTER — Encounter
Admission: RE | Disposition: A | Discharge status: Home / Self Care | Payer: Self-pay | Source: Home / Self Care | Attending: Neurosurgery

## 2019-04-19 ENCOUNTER — Encounter: Payer: Self-pay | Admitting: Neurosurgery

## 2019-04-19 ENCOUNTER — Ambulatory Visit
Admission: RE | Admit: 2019-04-19 | Discharge: 2019-04-19 | Disposition: A | Discharge status: Home / Self Care | Payer: Medicare Other | Attending: Neurosurgery | Admitting: Neurosurgery

## 2019-04-19 ENCOUNTER — Ambulatory Visit: Payer: Medicare Other

## 2019-04-19 ENCOUNTER — Other Ambulatory Visit: Payer: Self-pay

## 2019-04-19 DIAGNOSIS — M5416 Radiculopathy, lumbar region: Secondary | ICD-10-CM | POA: Insufficient documentation

## 2019-04-19 DIAGNOSIS — Z87442 Personal history of urinary calculi: Secondary | ICD-10-CM | POA: Diagnosis not present

## 2019-04-19 DIAGNOSIS — M48061 Spinal stenosis, lumbar region without neurogenic claudication: Secondary | ICD-10-CM | POA: Insufficient documentation

## 2019-04-19 DIAGNOSIS — Z79899 Other long term (current) drug therapy: Secondary | ICD-10-CM | POA: Diagnosis not present

## 2019-04-19 DIAGNOSIS — Z7984 Long term (current) use of oral hypoglycemic drugs: Secondary | ICD-10-CM | POA: Diagnosis not present

## 2019-04-19 DIAGNOSIS — Z419 Encounter for procedure for purposes other than remedying health state, unspecified: Secondary | ICD-10-CM

## 2019-04-19 DIAGNOSIS — M1990 Unspecified osteoarthritis, unspecified site: Secondary | ICD-10-CM | POA: Insufficient documentation

## 2019-04-19 DIAGNOSIS — I10 Essential (primary) hypertension: Secondary | ICD-10-CM | POA: Diagnosis not present

## 2019-04-19 DIAGNOSIS — E119 Type 2 diabetes mellitus without complications: Secondary | ICD-10-CM | POA: Diagnosis not present

## 2019-04-19 DIAGNOSIS — Z87891 Personal history of nicotine dependence: Secondary | ICD-10-CM | POA: Insufficient documentation

## 2019-04-19 HISTORY — PX: LUMBAR LAMINECTOMY/ DECOMPRESSION WITH MET-RX: SHX5959

## 2019-04-19 LAB — URINALYSIS, ROUTINE W REFLEX MICROSCOPIC
Bacteria, UA: NONE SEEN
Bilirubin Urine: NEGATIVE
Glucose, UA: 50 mg/dL — AB
Hgb urine dipstick: NEGATIVE
Ketones, ur: NEGATIVE mg/dL
Nitrite: NEGATIVE
Protein, ur: NEGATIVE mg/dL
Specific Gravity, Urine: 1.018 (ref 1.005–1.030)
pH: 5 (ref 5.0–8.0)

## 2019-04-19 LAB — GLUCOSE, CAPILLARY
Glucose-Capillary: 114 mg/dL — ABNORMAL HIGH (ref 70–99)
Glucose-Capillary: 114 mg/dL — ABNORMAL HIGH (ref 70–99)

## 2019-04-19 LAB — ABO/RH: ABO/RH(D): A POS

## 2019-04-19 SURGERY — LUMBAR LAMINECTOMY/ DECOMPRESSION WITH MET-RX
Anesthesia: General | Laterality: Left

## 2019-04-19 MED ORDER — ONDANSETRON HCL 4 MG/2ML IJ SOLN: 4.0000 mg | Freq: Once | INTRAMUSCULAR | Status: DC | PRN

## 2019-04-19 MED ORDER — ONDANSETRON HCL 4 MG/2ML IJ SOLN
INTRAMUSCULAR | Status: DC | PRN
  Administered 2019-04-19: 4 mg via INTRAVENOUS

## 2019-04-19 MED ORDER — PHENYLEPHRINE HCL (PRESSORS) 10 MG/ML IV SOLN
INTRAVENOUS | Status: DC | PRN
  Administered 2019-04-19 (×2): 100 ug via INTRAVENOUS

## 2019-04-19 MED ORDER — METHYLPREDNISOLONE ACETATE 40 MG/ML IJ SUSP
INTRAMUSCULAR | Status: DC | PRN
  Administered 2019-04-19: 40 mg

## 2019-04-19 MED ORDER — CYCLOBENZAPRINE HCL 10 MG PO TABS: 10.0000 mg | ORAL_TABLET | Freq: Three times a day (TID) | ORAL | 0 refills | Status: AC | PRN

## 2019-04-19 MED ORDER — FENTANYL CITRATE (PF) 100 MCG/2ML IJ SOLN: 25.0000 ug | INTRAMUSCULAR | Status: DC | PRN

## 2019-04-19 MED ORDER — LIDOCAINE HCL (CARDIAC) PF 100 MG/5ML IV SOSY
PREFILLED_SYRINGE | INTRAVENOUS | Status: DC | PRN
  Administered 2019-04-19: 100 mg via INTRAVENOUS

## 2019-04-19 MED ORDER — PROPOFOL 10 MG/ML IV BOLUS
INTRAVENOUS | Status: AC
  Filled 2019-04-19: qty 20

## 2019-04-19 MED ORDER — FENTANYL CITRATE (PF) 100 MCG/2ML IJ SOLN
INTRAMUSCULAR | Status: DC | PRN
  Administered 2019-04-19: 100 ug via INTRAVENOUS

## 2019-04-19 MED ORDER — OXYCODONE HCL 5 MG PO TABS: 5.0000 mg | ORAL_TABLET | ORAL | 0 refills | Status: AC | PRN

## 2019-04-19 MED ORDER — SODIUM CHLORIDE 0.9 % IV SOLN: INTRAVENOUS | Status: DC

## 2019-04-19 MED ORDER — LACTATED RINGERS IV SOLN: INTRAVENOUS | Status: DC | PRN

## 2019-04-19 MED ORDER — FAMOTIDINE 20 MG PO TABS: 20.0000 mg | ORAL_TABLET | Freq: Once | ORAL | Status: AC

## 2019-04-19 MED ORDER — PROPOFOL 10 MG/ML IV BOLUS
INTRAVENOUS | Status: DC | PRN
  Administered 2019-04-19: 160 mg via INTRAVENOUS

## 2019-04-19 MED ORDER — DEXAMETHASONE SODIUM PHOSPHATE 10 MG/ML IJ SOLN
INTRAMUSCULAR | Status: DC | PRN
  Administered 2019-04-19: 10 mg via INTRAVENOUS

## 2019-04-19 MED ORDER — SODIUM CHLORIDE FLUSH 0.9 % IV SOLN
INTRAVENOUS | Status: AC
  Filled 2019-04-19: qty 10

## 2019-04-19 MED ORDER — ROCURONIUM BROMIDE 100 MG/10ML IV SOLN
INTRAVENOUS | Status: DC | PRN
  Administered 2019-04-19: 5 mg via INTRAVENOUS

## 2019-04-19 MED ORDER — PHENYLEPHRINE HCL-NACL 10-0.9 MG/250ML-% IV SOLN
INTRAVENOUS | Status: DC | PRN
  Administered 2019-04-19: 50 ug/min via INTRAVENOUS

## 2019-04-19 MED ORDER — FENTANYL CITRATE (PF) 250 MCG/5ML IJ SOLN
INTRAMUSCULAR | Status: AC
  Filled 2019-04-19: qty 5

## 2019-04-19 MED ORDER — THROMBIN 5000 UNITS EX SOLR
CUTANEOUS | Status: DC | PRN
  Administered 2019-04-19: 5000 [IU] via TOPICAL

## 2019-04-19 MED ORDER — CEFAZOLIN SODIUM-DEXTROSE 2-4 GM/100ML-% IV SOLN
2.0000 g | Freq: Once | INTRAVENOUS | Status: AC | PRN
  Administered 2019-04-19: 2 g via INTRAVENOUS

## 2019-04-19 MED ORDER — LIDOCAINE-EPINEPHRINE (PF) 1 %-1:200000 IJ SOLN
INTRAMUSCULAR | Status: DC | PRN
  Administered 2019-04-19: 8 mL

## 2019-04-19 MED ORDER — SUCCINYLCHOLINE CHLORIDE 20 MG/ML IJ SOLN
INTRAMUSCULAR | Status: DC | PRN
  Administered 2019-04-19: 50 mg via INTRAVENOUS

## 2019-04-19 MED ORDER — CEFAZOLIN SODIUM-DEXTROSE 2-4 GM/100ML-% IV SOLN
INTRAVENOUS | Status: AC
  Filled 2019-04-19: qty 100

## 2019-04-19 MED ORDER — FAMOTIDINE 20 MG PO TABS
ORAL_TABLET | ORAL | Status: AC
  Administered 2019-04-19: 07:00:00 20 mg via ORAL
  Filled 2019-04-19: qty 1

## 2019-04-19 SURGICAL SUPPLY — 57 items
BUR NEURO DRILL SOFT 3.0X3.8M (BURR) ×3 IMPLANT
CANISTER SUCT 1200ML W/VALVE (MISCELLANEOUS) ×6 IMPLANT
CHLORAPREP W/TINT 26 (MISCELLANEOUS) ×6 IMPLANT
COUNTER NEEDLE 20/40 LG (NEEDLE) ×3 IMPLANT
COVER LIGHT HANDLE STERIS (MISCELLANEOUS) ×6 IMPLANT
COVER WAND RF STERILE (DRAPES) ×3 IMPLANT
CUP MEDICINE 2OZ PLAST GRAD ST (MISCELLANEOUS) ×3 IMPLANT
DERMABOND ADVANCED (GAUZE/BANDAGES/DRESSINGS) ×2
DERMABOND ADVANCED .7 DNX12 (GAUZE/BANDAGES/DRESSINGS) ×1 IMPLANT
DRAPE C-ARM 42X72 X-RAY (DRAPES) ×9 IMPLANT
DRAPE LAPAROTOMY 100X77 ABD (DRAPES) ×3 IMPLANT
DRAPE MICROSCOPE SPINE 48X150 (DRAPES) IMPLANT
DRAPE SURG 17X11 SM STRL (DRAPES) ×3 IMPLANT
DRSG TEGADERM 4X4.75 (GAUZE/BANDAGES/DRESSINGS) IMPLANT
DRSG TELFA 4X3 1S NADH ST (GAUZE/BANDAGES/DRESSINGS) IMPLANT
DURASEAL APPLICATOR TIP (TIP) IMPLANT
DURASEAL SPINE SEALANT 3ML (MISCELLANEOUS) IMPLANT
ELECT CAUTERY BLADE TIP 2.5 (TIP) ×3
ELECT EZSTD 165MM 6.5IN (MISCELLANEOUS) ×3
ELECT REM PT RETURN 9FT ADLT (ELECTROSURGICAL) ×3
ELECTRODE CAUTERY BLDE TIP 2.5 (TIP) ×1 IMPLANT
ELECTRODE EZSTD 165MM 6.5IN (MISCELLANEOUS) ×1 IMPLANT
ELECTRODE REM PT RTRN 9FT ADLT (ELECTROSURGICAL) ×1 IMPLANT
GAUZE SPONGE 4X4 12PLY STRL (GAUZE/BANDAGES/DRESSINGS) ×3 IMPLANT
GLOVE BIOGEL PI IND STRL 7.0 (GLOVE) ×1 IMPLANT
GLOVE BIOGEL PI INDICATOR 7.0 (GLOVE) ×2
GLOVE INDICATOR 8.0 STRL GRN (GLOVE) ×9 IMPLANT
GLOVE SURG SYN 7.0 (GLOVE) ×6 IMPLANT
GLOVE SURG SYN 8.0 (GLOVE) ×3 IMPLANT
GOWN STRL REUS W/ TWL XL LVL3 (GOWN DISPOSABLE) ×1 IMPLANT
GOWN STRL REUS W/TWL MED LVL3 (GOWN DISPOSABLE) ×3 IMPLANT
GOWN STRL REUS W/TWL XL LVL3 (GOWN DISPOSABLE) ×2
GRADUATE 1200CC STRL 31836 (MISCELLANEOUS) ×3 IMPLANT
IV CATH ANGIO 12GX3 LT BLUE (NEEDLE) ×3 IMPLANT
KIT TURNOVER KIT A (KITS) ×3 IMPLANT
KIT WILSON FRAME (KITS) ×3 IMPLANT
KNIFE BAYONET SHORT DISCETOMY (MISCELLANEOUS) IMPLANT
MARKER SKIN DUAL TIP RULER LAB (MISCELLANEOUS) ×6 IMPLANT
NDL SAFETY ECLIPSE 18X1.5 (NEEDLE) ×1 IMPLANT
NEEDLE HYPO 18GX1.5 SHARP (NEEDLE) ×2
NEEDLE HYPO 22GX1.5 SAFETY (NEEDLE) ×3 IMPLANT
NS IRRIG 1000ML POUR BTL (IV SOLUTION) ×3 IMPLANT
PACK LAMINECTOMY NEURO (CUSTOM PROCEDURE TRAY) ×3 IMPLANT
PAD ARMBOARD 7.5X6 YLW CONV (MISCELLANEOUS) ×3 IMPLANT
SPOGE SURGIFLO 8M (HEMOSTASIS) ×2
SPONGE SURGIFLO 8M (HEMOSTASIS) ×1 IMPLANT
STAPLER SKIN PROX 35W (STAPLE) IMPLANT
SUT NURALON 4 0 TR CR/8 (SUTURE) IMPLANT
SUT POLYSORB 2-0 5X18 GS-10 (SUTURE) ×3 IMPLANT
SUT VIC AB 0 CT1 18XCR BRD 8 (SUTURE) ×1 IMPLANT
SUT VIC AB 0 CT1 8-18 (SUTURE) ×2
SYR 10ML LL (SYRINGE) ×6 IMPLANT
SYR 30ML LL (SYRINGE) ×3 IMPLANT
SYR 3ML LL SCALE MARK (SYRINGE) ×3 IMPLANT
TOWEL OR 17X26 4PK STRL BLUE (TOWEL DISPOSABLE) ×12 IMPLANT
TUBING CONNECTING 10 (TUBING) ×2 IMPLANT
TUBING CONNECTING 10' (TUBING) ×1

## 2019-04-19 NOTE — Discharge Instructions (Addendum)
AMBULATORY SURGERY  DISCHARGE INSTRUCTIONS   1) The drugs that you were given will stay in your system until tomorrow so for the next 24 hours you should not:  A) Drive an automobile B) Make any legal decisions C) Drink any alcoholic beverage   2) You may resume regular meals tomorrow.  Today it is better to start with liquids and gradually work up to solid foods.  You may eat anything you prefer, but it is better to start with liquids, then soup and crackers, and gradually work up to solid foods.   3) Please notify your doctor immediately if you have any unusual bleeding, trouble breathing, redness and pain at the surgery site, drainage, fever, or pain not relieved by medication.    4) Additional Instructions:        Please contact your physician with any problems or Same Day Surgery at [phone], Monday through Friday 6 am to 4 pm, or [place] [facility] [place] [facility] number at [phone]. Your surgeon has performed an operation on your lumbar spine (low back) to relieve pressure on one or more nerves. Many times, patients feel better immediately after surgery and can "overdo it." Even if you feel well, it is important that you follow these activity guidelines. If you do not let your back heal properly from the surgery, you can increase the chance of a disc herniation and/or return of your symptoms. The following are instructions to help in your recovery once you have been discharged from the hospital.  * Do not take anti-inflammatory medications for 3 days after surgery (naproxen [Aleve], ibuprofen [Advil, Motrin], celecoxib [Celebrex], etc.)  Activity    No bending, lifting, or twisting ("BLT"). Avoid lifting objects heavier than 10 pounds (gallon milk jug).  Where possible, avoid household activities that involve lifting, bending, pushing, or pulling such as laundry, vacuuming, grocery shopping, and childcare. Try to arrange for help from friends and family for these  activities while your back heals.  Increase physical activity slowly as tolerated.  Taking short walks is encouraged, but avoid strenuous exercise. Do not jog, run, bicycle, lift weights, or participate in any other exercises unless specifically allowed by your doctor. Avoid prolonged sitting, including car rides.  Talk to your doctor before resuming sexual activity.  You should not drive until cleared by your doctor.  Until released by your doctor, you should not return to work or school.  You should rest at home and let your body heal.   You may shower two days after your surgery.  After showering, lightly dab your incision dry. Do not take a tub bath or go swimming for 3 weeks, or until approved by your doctor at your follow-up appointment.  If you smoke, we strongly recommend that you quit.  Smoking has been proven to interfere with normal healing in your back and will dramatically reduce the success rate of your surgery. Please contact [facility] (800-QUIT-NOW) and use the resources at www.[facility].com for assistance in stopping smoking.  Surgical Incision   If you have a dressing on your incision, you may remove it three days after your surgery. Keep your incision area clean and dry.  If you have staples or stitches on your incision, you should have a follow up scheduled for removal. If you do not have staples or stitches, you will have steri-strips (small pieces of surgical tape) or Dermabond glue. The steri-strips/glue should begin to peel away within about a week (it is fine if the steri-strips fall off before   then). If the strips are still in place one week after your surgery, you may gently remove them.  Diet            You may return to your usual diet. Be sure to stay hydrated.  When to Contact Us  Although your surgery and recovery will likely be uneventful, you may have some residual numbness, aches, and pains in your back and/or legs. This is normal and should improve in  the next few weeks.  However, should you experience any of the following, contact us immediately: . New numbness or weakness . Pain that is progressively getting worse, and is not relieved by your pain medications or rest . Bleeding, redness, swelling, pain, or drainage from surgical incision . Chills or flu-like symptoms . Fever greater than 101.0 F (38.3 C) . Problems with bowel or bladder functions . Difficulty breathing or shortness of breath . Warmth, tenderness, or swelling in your calf  Contact Information . During office hours (Monday-Friday 9 am to 5 pm), please call your physician at [phone] . After hours and weekends, please call [phone] and an answering service will put you in touch with either Dr. Cook or Dr. Yarbrough.  . For a life-threatening emergency, call 911  

## 2019-04-19 NOTE — Interval H&P Note (Signed)
History and Physical Interval Note:  04/19/2019 6:41 AM  Jonathan Oneal  has presented today for surgery, with the diagnosis of m54.16 lumbar radiculopathy.  The various methods of treatment have been discussed with the patient and family. After consideration of risks, benefits and other options for treatment, the patient has consented to  Procedure(s): L4/5 FAR LATERAL DISCECTOMY (Left) as a surgical intervention.  The patient's history has been reviewed, patient examined, no change in status, stable for surgery.  I have reviewed the patient's chart and labs.  Questions were answered to the patient's satisfaction.     Deetta Perla

## 2019-04-19 NOTE — Op Note (Signed)
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 He could not get repeat injections given severe pain with procedure. MRI revealedleftL4/5 far lateralstenosisdue to disc herniation.We discussed decompression at that level.Therisks of surgery were explained to include hematoma, infection, damage to nerve roots, CSF leak, weakness, numbness, pain, need for future surgery including fusion, heart attack, and stroke.Heelected to proceed with surgery for symptom relief.  Procedure The patient was brought to the OR after informed consent was obtained.He was given general anesthesia and intubated by the anesthesia service. Vascular access lines were placed.The patient was then placed prone on a Wilson frameensuring all pressure points were padded.Antibiotics were administered.A time-out was performed per protocol.   The patient was sterilely prepped and draped. Fluoroscopy confirmedL4/5interspaceandanincision was planned5-6 cm off midlineon theleft.The incision was instilled withlocal anesthetic with epinephrine. The skin was opened sharply and the dissection taken to the fascia. This was incised and initial dilator placedthe transverse process of L5on theleft. Serial dilatorswere inserted via fluoroscopy and the final70mm tube was placed at  depth of6cm.  The microscope was brought into the field. The overlying muscle was removed from transverse process and lateral facet.  The muscle was dissected above transverse process until the disc space was seen. Perineural fat was dissected and the exiting nerve root seen. Beneath this, the disc space was palpated and no soft disc herniation seen.  Fluoroscopy confirmed the correct level. We explored medially and superiorly and freely passed a blunt tool into the foramen. No further compression was seen of the nerve root at this point. The area was irrigated profusely and hemostasis was obtained. Depo medrol was placed along course of nerve root..The working channel was removed.  The microscope was removed.Themuscle andfasciawas then closed using 2-0vicryl followed by thesubcutaneous and dermal layers with 2-0 vicryluntil the epidermis was well approximated. The skin was closed withDermabond..  The patient was returned to supine position and extubated by the anesthesia service. The patient was then taken to the PACU for post-operative care wherehe was moving extremities symmetrically.   ESTIMATED BLOOD LOSS: 5cc  SPECIMENS None  IMPLANT None   I performed the case in its entiretywith assistance of PA, Jonathan Oneal, [place]

## 2019-04-19 NOTE — Anesthesia Postprocedure Evaluation (Signed)
Anesthesia Post Note  Patient: Jonathan Oneal  Procedure(s) Performed: L4/5 FAR LATERAL DISCECTOMY (Left )  Patient location during evaluation: PACU Anesthesia Type: General Level of consciousness: awake and alert Pain management: pain level controlled Vital Signs Assessment: post-procedure vital signs reviewed and stable Respiratory status: spontaneous breathing and respiratory function stable Cardiovascular status: stable Anesthetic complications: no     Last Vitals:  Vitals:   04/19/19 0919 04/19/19 0921  BP: (!) 150/86   Pulse: 78 81  Resp:  13  Temp: (!) 36.1 C   SpO2: 96% 96%    Last Pain:  Vitals:   04/19/19 0919  TempSrc:   PainSc: Asleep                 Nikoleta Dady K

## 2019-04-19 NOTE — H&P (Signed)
Jonathan Oneal is an 69 y.o. male.   Chief Complaint: Left leg pain HPI: Jonathan Oneal is here for follow-up of his left anterior thigh pain. After his last visit, he did proceed with physical therapy and he does state that he is gotten approximate 50% relief, however he continues to have pain with most movements and is restricted in some of the activities he does. Given the continued pain, he would like to consider moving forward with surgery for decompression. He denies any right leg symptoms. He denies any persistent numbness.  MRI showed lateral protrusion at the left L4/5 foramen.    Past Medical History:  Diagnosis Date  . Arthritis    hands, shoulders, neck  . Depression   . Diabetes mellitus without complication ([place])   . Headache    migraine, tension  . History of kidney stones   . Hypertension   . Tendonitis    shoulders, elbows    Past Surgical History:  Procedure Laterality Date  . COLONOSCOPY  2013  . COLONOSCOPY N/A 06/06/2015   Procedure: COLONOSCOPY;  Surgeon: Hulen Luster, MD;  Location: [facility];  Service: Gastroenterology;  Laterality: N/A;  DIABETIC ORAL  . CYSTOSCOPY PROSTATE W/ LASER    . CYSTOSCOPY PROSTATE W/ LASER    . HAND SURGERY Right    x 3  . NECK SURGERY     fusion C-4,5,6 or C-5,6,7   . POLYPECTOMY  06/06/2015   Procedure: POLYPECTOMY;  Surgeon: Hulen Luster, MD;  Location: [facility];  Service: Gastroenterology;;  . TOOTH EXTRACTION    . WISDOM TOOTH EXTRACTION      History reviewed. No pertinent family history. Social History:  reports that he has quit smoking. His smoking use included cigarettes. He has never used smokeless tobacco. He reports current alcohol use. He reports that he does not use drugs.  Allergies:  Allergies  Allergen Reactions  . Nsaids Other (See Comments)    Nose bleeds    Medications Prior to Admission  Medication Sig Dispense Refill  . acyclovir (ZOVIRAX) 400 MG tablet Take 400 mg by mouth 2 (two) times  daily.    Marland Kitchen allopurinol (ZYLOPRIM) 300 MG tablet Take 300 mg by mouth daily.    Marland Kitchen atorvastatin (LIPITOR) 10 MG tablet Take 10 mg by mouth daily.     . cholecalciferol (VITAMIN D3) 25 MCG (1000 UNIT) tablet Take 1,000 Units by mouth daily.    Marland Kitchen CINNAMON PO Take 1 capsule by mouth daily.    . cyanocobalamin 1000 MCG tablet Take 1,000 mcg by mouth daily.    . cyclobenzaprine (FLEXERIL) 10 MG tablet Take 1 tablet (10 mg total) by mouth 3 (three) times daily as needed for muscle spasms. 21 tablet 0  . ELDERBERRY PO Take 1 tablet by mouth in the morning and at bedtime.     . gabapentin (NEURONTIN) 300 MG capsule Take 300 mg by mouth 3 (three) times daily.    . metFORMIN (GLUCOPHAGE) 1000 MG tablet Take 1,000 mg by mouth 2 (two) times daily with a meal.     . metoprolol succinate (TOPROL-XL) 50 MG 24 hr tablet Take 50 mg by mouth daily. Take with or immediately following a meal.    . olmesartan (BENICAR) 40 MG tablet Take 40 mg by mouth daily.    . pioglitazone (ACTOS) 15 MG tablet Take 15 mg by mouth daily.    . TURMERIC PO Take 1 tablet by mouth daily.    Marland Kitchen  butalbital-acetaminophen-caffeine (FIORICET, ESGIC) 50-325-40 MG tablet Take 1 tablet by mouth every 6 (six) hours as needed for headache.       Results for orders placed or performed during the hospital encounter of 04/19/19 (from the past 48 hour(s))  Glucose, capillary     Status: Abnormal   Collection Time: 04/19/19  6:19 AM  Result Value Ref Range   Glucose-Capillary 114 (H) 70 - 99 mg/dL    Comment: Glucose reference range applies only to samples taken after fasting for at least 8 hours.   No results found.  Review of Systems General ROS: Negative Respiratory ROS: Negative Cardiovascular ROS: Negative Gastrointestinal ROS: Negative Genito-Urinary ROS: Negative Musculoskeletal ROS: Positive for back pain Neurological ROS: Positive for left leg pain Dermatological ROS: Negative  Blood pressure (!) 177/101, pulse 82, temperature  97.8 F (36.6 C), temperature source Tympanic, resp. rate 16, SpO2 100 %. Physical Exam   CV: Regular rate and rhythm Pulm: Clear to auscultation  Mental status: alertness: alert, affect: normal Speech: fluent and clear Motor:strength symmetric 5/5 in bilateral lower extremities in all motor groups Sensory: intact to light touch in bilateral lower extremities Gait: normal    Assessment/Plan We will proceed with a far lateral L4-5 discectomy   Deetta Perla, MD 04/19/2019, 6:39 AM

## 2019-04-19 NOTE — Anesthesia Preprocedure Evaluation (Addendum)
Anesthesia Evaluation  Patient identified by MRN, date of birth, ID band Patient awake    Reviewed: Allergy & Precautions, NPO status , Patient's Chart, lab work & pertinent test results  History of Anesthesia Complications Negative for: history of anesthetic complications  Airway Mallampati: II       Dental  (+) Chipped   Pulmonary neg sleep apnea, neg COPD, Not current smoker, former smoker,           Cardiovascular hypertension, Pt. on medications (-) Past MI and (-) CHF (-) dysrhythmias (-) Valvular Problems/Murmurs     Neuro/Psych neg Seizures Depression    GI/Hepatic Neg liver ROS, neg GERD  ,  Endo/Other  diabetes, Type 2, Oral Hypoglycemic Agents  Renal/GU Renal disease (stones)     Musculoskeletal   Abdominal   Peds  Hematology   Anesthesia Other Findings   Reproductive/Obstetrics                             Anesthesia Physical Anesthesia Plan  ASA: III  Anesthesia Plan: General   Post-op Pain Management:    Induction: Intravenous  PONV Risk Score and Plan: 2 and Ondansetron and Midazolam  Airway Management Planned: Oral ETT  Additional Equipment:   Intra-op Plan:   Post-operative Plan:   Informed Consent: I have reviewed the patients History and Physical, chart, labs and discussed the procedure including the risks, benefits and alternatives for the proposed anesthesia with the patient or authorized representative who has indicated his/her understanding and acceptance.       Plan Discussed with:   Anesthesia Plan Comments:         Anesthesia Quick Evaluation

## 2019-04-19 NOTE — Anesthesia Procedure Notes (Signed)
Procedure Name: Intubation Date/Time: 04/19/2019 7:25 AM Performed by: Natasha Mead, CRNA Pre-anesthesia Checklist: Patient identified, Emergency Drugs available, Suction available, Patient being monitored and Timeout performed Patient Re-evaluated:Patient Re-evaluated prior to induction Oxygen Delivery Method: Circle system utilized Preoxygenation: Pre-oxygenation with 100% oxygen Induction Type: IV induction Ventilation: Mask ventilation without difficulty Laryngoscope Size: Miller and 2 Grade View: Grade I Tube size: 7.5 mm Number of attempts: 1 Placement Confirmation: ETT inserted through vocal cords under direct vision Secured at: 21 cm Tube secured with: Tape Dental Injury: Teeth and Oropharynx as per pre-operative assessment

## 2019-04-19 NOTE — Transfer of Care (Signed)
Immediate Anesthesia Transfer of Care Note  Patient: Jonathan Oneal  Procedure(s) Performed: L4/5 FAR LATERAL DISCECTOMY (Left )  Patient Location: PACU  Anesthesia Type:General  Level of Consciousness: awake and drowsy  Airway & Oxygen Therapy: Patient Spontanous Breathing  Post-op Assessment: Report given to RN  Post vital signs: stable  Last Vitals:  Vitals Value Taken Time  BP 150/86 04/19/19 0920  Temp 36.1 C 04/19/19 0919  Pulse 84 04/19/19 0931  Resp 16 04/19/19 0931  SpO2 99 % 04/19/19 0931  Vitals shown include unvalidated device data.  Last Pain:  Vitals:   04/19/19 0919  TempSrc:   PainSc: Asleep         Complications: No apparent anesthesia complications

## 2019-04-19 NOTE — Discharge Summary (Signed)
Procedure: L4-5 left far lateral discectomy Procedure date: 04/19/2019 Diagnosis: lumbar radiculopathy   History: Jonathan Oneal is s/p L4-5 left far lateral discectomy for lumbar radiculopathy  POD: Tolerated procedure well. Evaluated in post op recovery still disoriented from anesthesia but able to answer questions and obey commands. Denies any pain at this time - back and leg. Denies any new lower extremity numbness/tingling.   Physical Exam: Vitals:   04/19/19 0617 04/19/19 0650  BP: (!) 177/101 (!) 161/94  Pulse: 82 75  Resp: 16   Temp: 97.8 F (36.6 C)   SpO2: 100%    Strength: 5/5 throughout lower extremities Sensation: intact and symmetric throughout lower extremities Skin: glue intact at incision site  Data:  Recent Labs  Lab 04/15/19 1008  NA 138  K 4.1  CL 103  CO2 25  BUN 19  CREATININE 1.01  GLUCOSE 119*  CALCIUM 9.1   No results for input(s): AST, ALT, ALKPHOS in the last 168 hours.  Invalid input(s): TBILI   Recent Labs  Lab 04/15/19 1008  WBC 5.9  HGB 14.5  HCT 44.1  PLT 299   Recent Labs  Lab 04/15/19 1008  APTT 31  INR 1.0         Other tests/results: No imaging reviewed  Assessment/Plan:  Jonathan Oneal is POD0 s/p L4-5 left far lateral discectomy for lumbar radiculopathy. Will continue post op pain control with pain medication, muscle relaxer, and tylenol as needed. He is scheduled to follow up in clinic in approximately 2 weeks to monitor progress.    Marin Olp PA-C Department of Neurosurgery

## 2021-05-21 IMAGING — MR MR LUMBAR SPINE W/O CM
5 series · 31 of 48 positions shown · non-contrast
Comparison: None.

CLINICAL DATA: Left back pain and radiculopathy

EXAM:
MRI LUMBAR SPINE WITHOUT CONTRAST
TECHNIQUE: Multiplanar, multisequence MR imaging of the lumbar spine was
performed. No intravenous contrast was administered.

[Series 5: T2 · sagittal · 4.0mm · 0.81mm/px · 6 of 17 slices shown (1 of 2)]
[im 1/17]
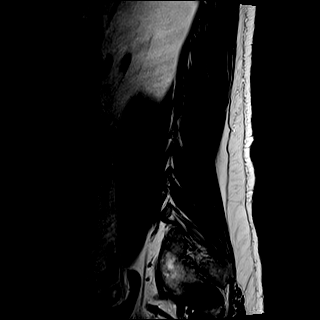
[im 4/17]
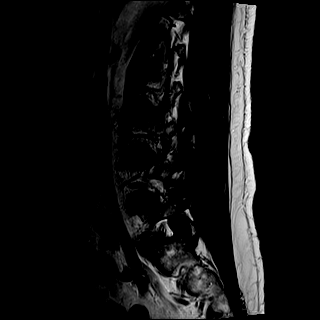
[im 7/17]
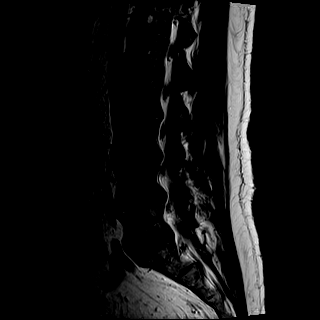
[im 10/17]
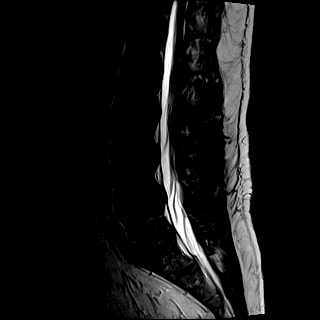
[im 13/17]
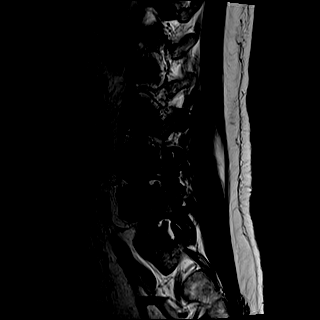
[im 17/17]
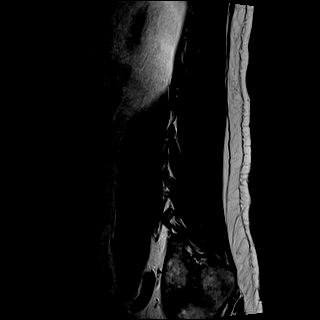

[Series 6: T1 · sagittal · 4.0mm · 0.81mm/px · 7 of 17 slices shown (1 of 2)]
[im 1/17]
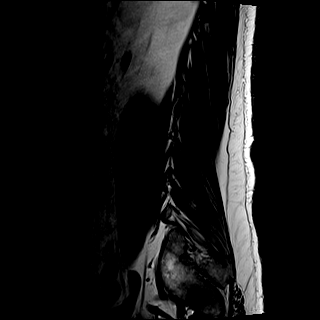
[im 3/17]
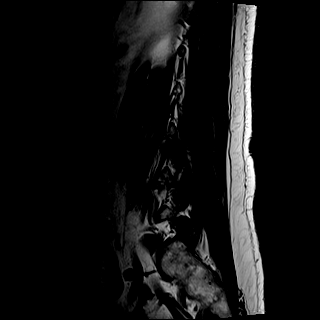
[im 6/17]
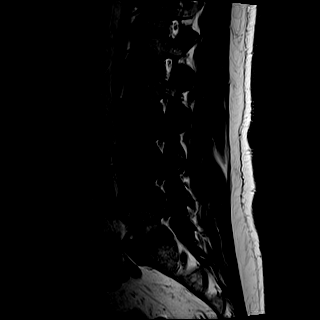
[im 9/17]
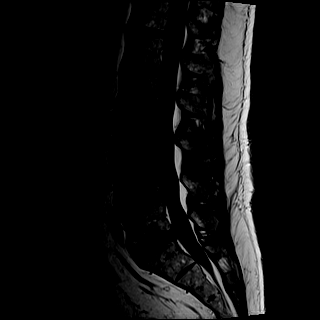
[im 11/17]
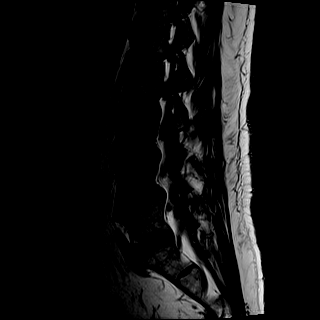
[im 14/17]
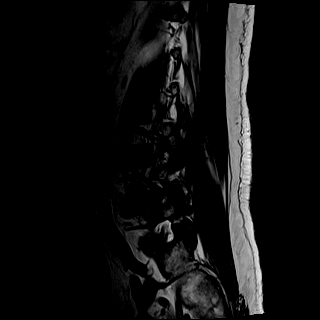
[im 17/17]
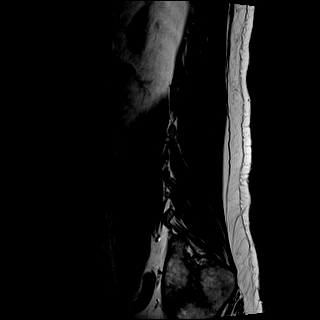

[Series 7: STIR · sagittal · 4.0mm · 0.41mm/px · 2 of 17 slices shown]
[im 1/17]
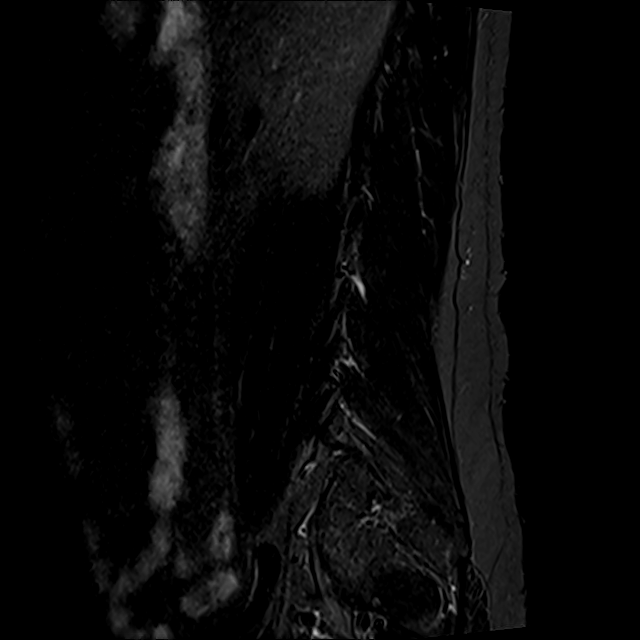
[im 3/17]
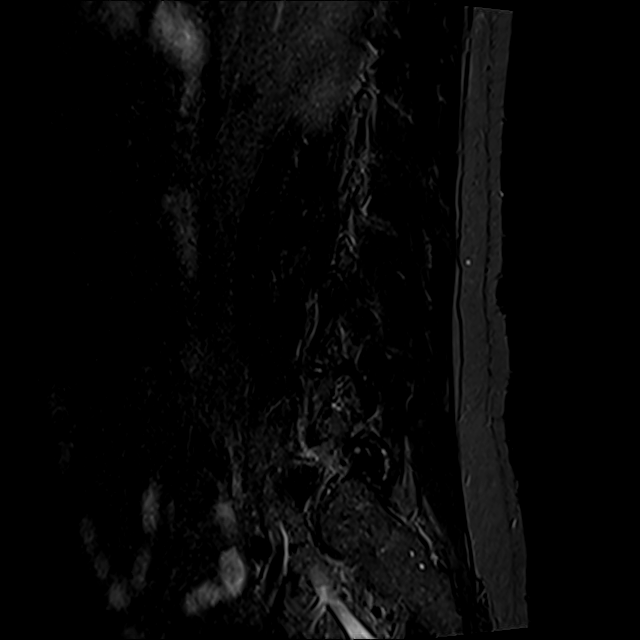

[Series 8: T2 · axial · 4.0mm · 0.78mm/px · z∈[-146,+61]mm · 8 of 36 slices shown (2 of 2)]
[im 1/36]
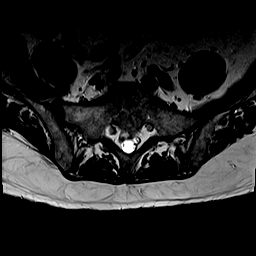
[im 6/36]
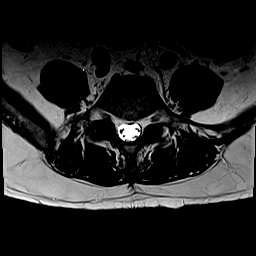
[im 11/36]
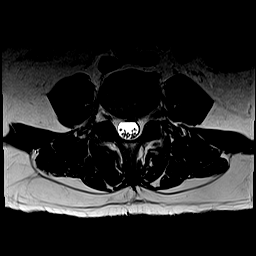
[im 17/36]
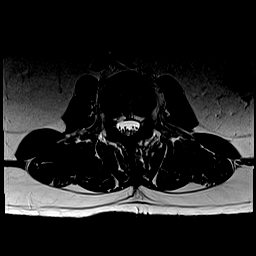
[im 19/36]
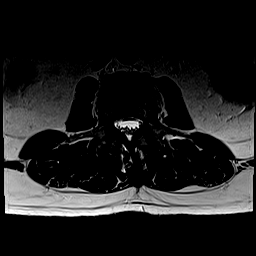
[im 25/36]
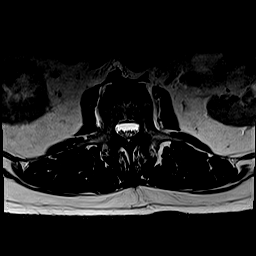
[im 30/36]
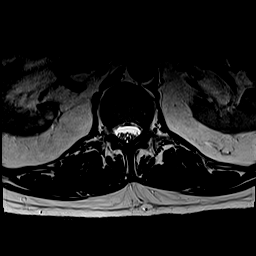
[im 36/36]
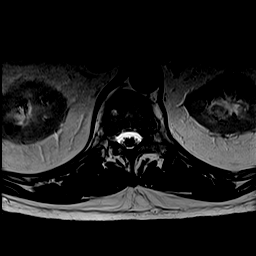

[Series 9: T1 · axial · 4.0mm · 0.39mm/px · z∈[-146,+61]mm · 8 of 36 slices shown (2 of 2)]
[im 1/36]
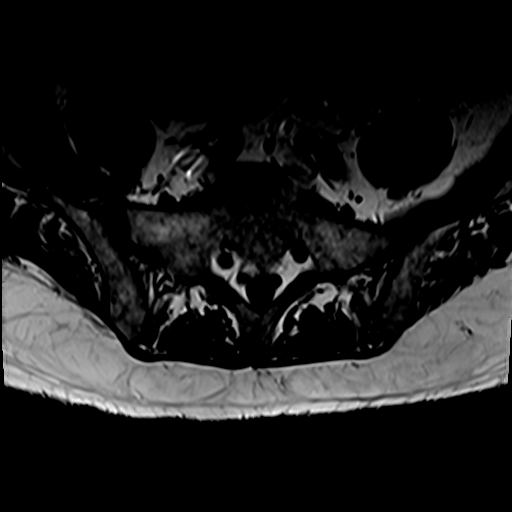
[im 6/36]
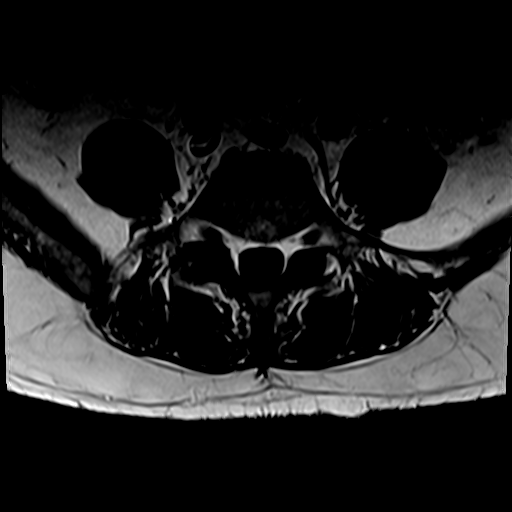
[im 11/36]
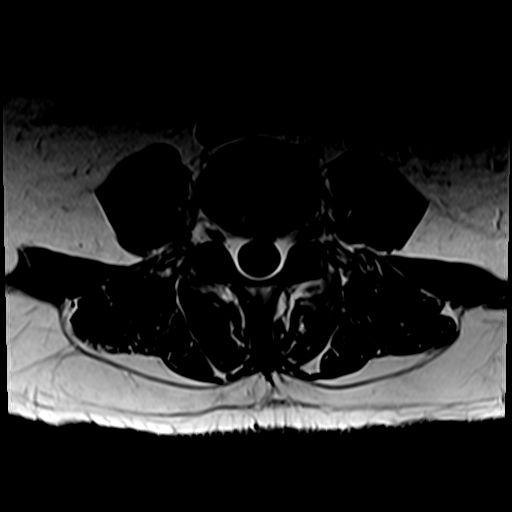
[im 17/36]
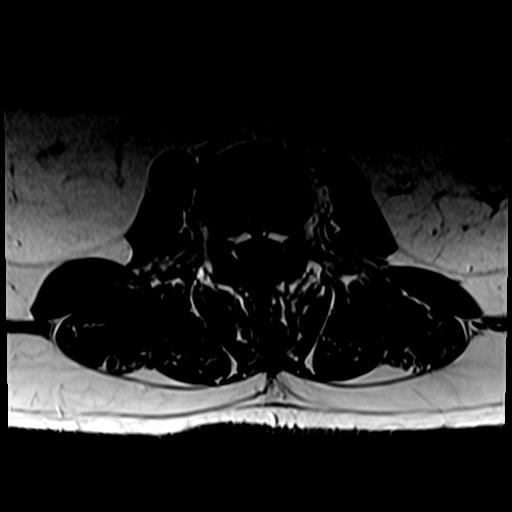
[im 19/36]
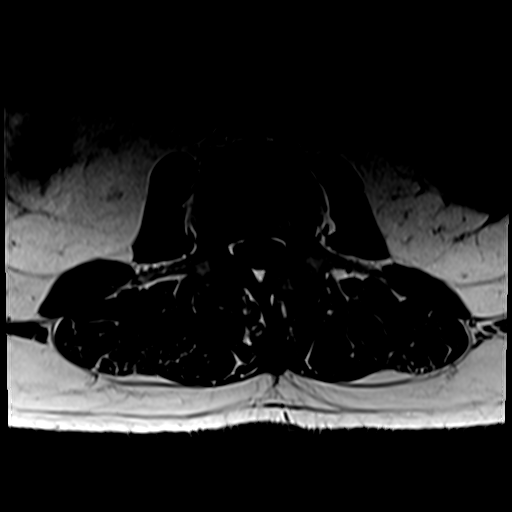
[im 25/36]
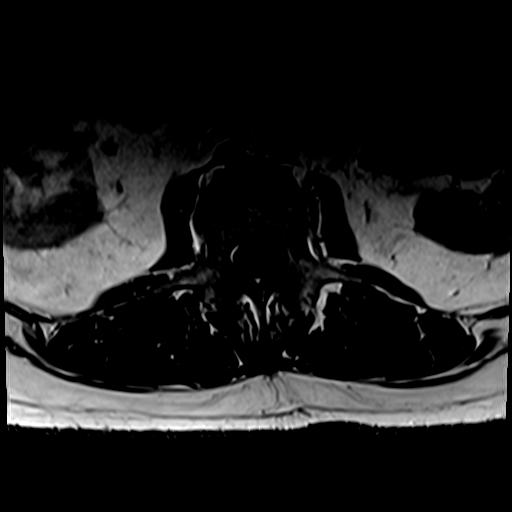
[im 30/36]
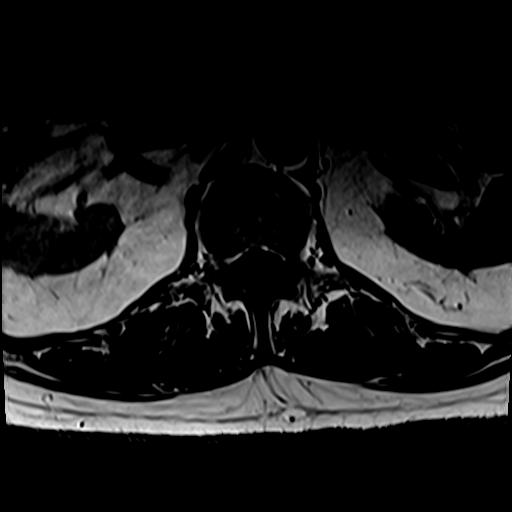
[im 36/36]
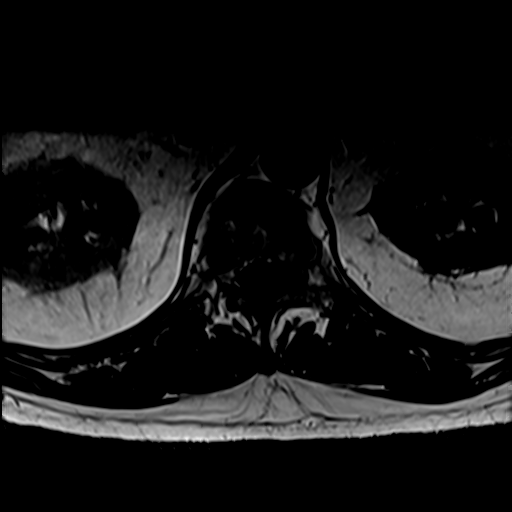

[31 of 48 positions shown; findings below may reference images not displayed]

FINDINGS: Segmentation:  Standard.

Alignment:  Preserved

Vertebrae: Vertebral body heights are maintained. There is no marrow
edema or suspicious osseous lesion.

Conus medullaris and cauda equina: Conus extends to the L1 level.
Conus and cauda equina appear normal.

Paraspinal and other soft tissues: Unremarkable.

Disc levels:

L1-L2:  No canal or foraminal stenosis.

L2-L3: Minimal disc bulge with superimposed right foraminal
protrusion. No canal or left foraminal stenosis. Minor right
foraminal stenosis.

L3-L4: Minimal disc bulge. No canal stenosis. Minor foraminal
stenosis, right greater than left.

L4-L5: Disc bulge, endplate osteophytic ridging, and mild facet
arthropathy. No canal stenosis. Minor right and mild left foraminal
stenosis. Disc/osteophyte abuts the exiting left L4 nerve root
without definite compression.

L5-S1: Minimal disc bulge and endplate osteophytic ridging. No canal
or left foraminal stenosis. Minor right foraminal stenosis.
IMPRESSION: Degenerative changes as detailed above, greatest at L4-L5.

## 2022-03-15 ENCOUNTER — Ambulatory Visit: Payer: Medicare Other | Admitting: Certified Registered"

## 2022-03-15 ENCOUNTER — Encounter: Payer: Self-pay | Admitting: *Deleted

## 2022-03-15 ENCOUNTER — Encounter
Admission: RE | Disposition: A | Discharge status: Home / Self Care | Payer: Self-pay | Source: Ambulatory Visit | Attending: Gastroenterology

## 2022-03-15 ENCOUNTER — Ambulatory Visit
Admission: RE | Admit: 2022-03-15 | Discharge: 2022-03-15 | Disposition: A | Discharge status: Home / Self Care | Payer: Medicare Other | Source: Ambulatory Visit | Attending: Gastroenterology | Admitting: Gastroenterology

## 2022-03-15 DIAGNOSIS — Z79899 Other long term (current) drug therapy: Secondary | ICD-10-CM | POA: Diagnosis not present

## 2022-03-15 DIAGNOSIS — Z7984 Long term (current) use of oral hypoglycemic drugs: Secondary | ICD-10-CM | POA: Diagnosis not present

## 2022-03-15 DIAGNOSIS — E119 Type 2 diabetes mellitus without complications: Secondary | ICD-10-CM | POA: Diagnosis not present

## 2022-03-15 DIAGNOSIS — D122 Benign neoplasm of ascending colon: Secondary | ICD-10-CM | POA: Insufficient documentation

## 2022-03-15 DIAGNOSIS — Z86010 Personal history of colonic polyps: Secondary | ICD-10-CM | POA: Insufficient documentation

## 2022-03-15 DIAGNOSIS — I10 Essential (primary) hypertension: Secondary | ICD-10-CM | POA: Diagnosis not present

## 2022-03-15 DIAGNOSIS — Z79624 Long term (current) use of inhibitors of nucleotide synthesis: Secondary | ICD-10-CM | POA: Insufficient documentation

## 2022-03-15 DIAGNOSIS — K640 First degree hemorrhoids: Secondary | ICD-10-CM | POA: Insufficient documentation

## 2022-03-15 DIAGNOSIS — K5730 Diverticulosis of large intestine without perforation or abscess without bleeding: Secondary | ICD-10-CM | POA: Insufficient documentation

## 2022-03-15 DIAGNOSIS — R197 Diarrhea, unspecified: Secondary | ICD-10-CM | POA: Diagnosis present

## 2022-03-15 HISTORY — PX: COLONOSCOPY WITH PROPOFOL: SHX5780

## 2022-03-15 LAB — GLUCOSE, CAPILLARY: Glucose-Capillary: 115 mg/dL — ABNORMAL HIGH (ref 70–99)

## 2022-03-15 SURGERY — COLONOSCOPY WITH PROPOFOL
Anesthesia: General

## 2022-03-15 MED ORDER — LIDOCAINE 2% (20 MG/ML) 5 ML SYRINGE
INTRAMUSCULAR | Status: DC | PRN
  Administered 2022-03-15: 20 mg via INTRAVENOUS

## 2022-03-15 MED ORDER — PROPOFOL 10 MG/ML IV BOLUS
INTRAVENOUS | Status: DC | PRN
  Administered 2022-03-15: 30 mg via INTRAVENOUS
  Administered 2022-03-15: 70 mg via INTRAVENOUS

## 2022-03-15 MED ORDER — SIMETHICONE 40 MG/0.6ML PO SUSP
ORAL | Status: DC | PRN
  Administered 2022-03-15: 60 mL

## 2022-03-15 MED ORDER — PROPOFOL 500 MG/50ML IV EMUL
INTRAVENOUS | Status: DC | PRN
  Administered 2022-03-15: 120 ug/kg/min via INTRAVENOUS

## 2022-03-15 MED ORDER — SODIUM CHLORIDE 0.9 % IV SOLN
INTRAVENOUS | Status: DC
  Administered 2022-03-15: 20 mL/h via INTRAVENOUS

## 2022-03-15 NOTE — H&P (Signed)
Outpatient short stay form Pre-procedure 03/15/2022  Lesly Rubenstein, MD  Primary Physician: Sofie Hartigan, MD  Reason for visit:  Loose stools  History of present illness:    72 y/o gentleman with history of diabetes, depression, and hypertension here for colonoscopy for loose stool. Last colonoscopy in 2017 with two adenomas. No blood thinners. No family history of GI malignancies. No significant abdominal surgeries.    Current Facility-Administered Medications:    0.9 %  sodium chloride infusion, , Intravenous, Continuous, Luan Urbani, Hilton Cork, MD, Last Rate: 20 mL/hr at 03/15/22 0809, 20 mL/hr at 03/15/22 0809  Medications Prior to Admission  Medication Sig Dispense Refill Last Dose   acyclovir (ZOVIRAX) 400 MG tablet Take 400 mg by mouth 2 (two) times daily.   Past Week   allopurinol (ZYLOPRIM) 300 MG tablet Take 300 mg by mouth daily.   Past Week   atorvastatin (LIPITOR) 10 MG tablet Take 10 mg by mouth daily.    03/14/2022   butalbital-acetaminophen-caffeine (FIORICET, ESGIC) 50-325-40 MG tablet Take 1 tablet by mouth every 6 (six) hours as needed for headache.    Past Week   cholecalciferol (VITAMIN D3) 25 MCG (1000 UNIT) tablet Take 1,000 Units by mouth daily.   Past Week   cyanocobalamin 1000 MCG tablet Take 1,000 mcg by mouth daily.   Past Week   cyclobenzaprine (FLEXERIL) 10 MG tablet Take 1 tablet (10 mg total) by mouth 3 (three) times daily as needed for muscle spasms. 60 tablet 0 Past Week   ELDERBERRY PO Take 1 tablet by mouth in the morning and at bedtime.    Past Week   gabapentin (NEURONTIN) 300 MG capsule Take 300 mg by mouth 3 (three) times daily.   Past Week   metFORMIN (GLUCOPHAGE) 1000 MG tablet Take 1,000 mg by mouth 2 (two) times daily with a meal.    Past Week   metoprolol succinate (TOPROL-XL) 50 MG 24 hr tablet Take 50 mg by mouth daily. Take with or immediately following a meal.   03/14/2022   olmesartan (BENICAR) 40 MG tablet Take 40 mg by mouth daily.    Past Week   oxyCODONE (ROXICODONE) 5 MG immediate release tablet Take 1 tablet (5 mg total) by mouth every 4 (four) hours as needed for breakthrough pain. 30 tablet 0 Past Week   pioglitazone (ACTOS) 15 MG tablet Take 15 mg by mouth daily.   Past Week   TURMERIC PO Take 1 tablet by mouth daily.   Past Week     Allergies  Allergen Reactions   Nsaids Other (See Comments)    Nose bleeds     Past Medical History:  Diagnosis Date   Arthritis    hands, shoulders, neck   Depression    Diabetes mellitus without complication (HCC)    Headache    migraine, tension   History of kidney stones    Hypertension    Tendonitis    shoulders, elbows    Review of systems:  Otherwise negative.    Physical Exam  Gen: Alert, oriented. Appears stated age.  HEENT: PERRLA. Lungs: No respiratory distress CV: RRR Abd: soft, benign, no masses Ext: No edema    Planned procedures: Proceed with colonoscopy. The patient understands the nature of the planned procedure, indications, risks, alternatives and potential complications including but not limited to bleeding, infection, perforation, damage to internal organs and possible oversedation/side effects from anesthesia. The patient agrees and gives consent to proceed.  Please refer to procedure notes  for findings, recommendations and patient disposition/instructions.     Lesly Rubenstein, MD [facility]

## 2022-03-15 NOTE — Anesthesia Preprocedure Evaluation (Signed)
Anesthesia Evaluation  Patient identified by MRN, date of birth, ID band Patient awake    Reviewed: Allergy & Precautions, NPO status , Patient's Chart, lab work & pertinent test results  History of Anesthesia Complications Negative for: history of anesthetic complications  Airway Mallampati: II       Dental  (+) Chipped, Dental Advidsory Given   Pulmonary neg shortness of breath, neg sleep apnea, neg COPD, neg recent URI, Not current smoker, former smoker          Cardiovascular hypertension, Pt. on medications (-) angina (-) Past MI and (-) CHF (-) dysrhythmias (-) Valvular Problems/Murmurs     Neuro/Psych neg Seizures PSYCHIATRIC DISORDERS  Depression    negative neurological ROS     GI/Hepatic negative GI ROS, Neg liver ROS,neg GERD  ,,  Endo/Other  diabetes, Type 2, Oral Hypoglycemic Agents    Renal/GU Renal disease (stones)     Musculoskeletal   Abdominal   Peds  Hematology   Anesthesia Other Findings Past Medical History: No date: Arthritis     Comment:  hands, shoulders, neck No date: Depression No date: Diabetes mellitus without complication (HCC) No date: Headache     Comment:  migraine, tension No date: History of kidney stones No date: Hypertension No date: Tendonitis     Comment:  shoulders, elbows   Reproductive/Obstetrics                             Anesthesia Physical Anesthesia Plan  ASA: 3  Anesthesia Plan: General   Post-op Pain Management:    Induction: Intravenous  PONV Risk Score and Plan: 2 and TIVA and Propofol infusion  Airway Management Planned: Natural Airway and Nasal Cannula  Additional Equipment:   Intra-op Plan:   Post-operative Plan:   Informed Consent: I have reviewed the patients History and Physical, chart, labs and discussed the procedure including the risks, benefits and alternatives for the proposed anesthesia with the patient or  authorized representative who has indicated his/her understanding and acceptance.       Plan Discussed with:   Anesthesia Plan Comments:         Anesthesia Quick Evaluation

## 2022-03-15 NOTE — Op Note (Signed)
[facility] Patient Name: Jonathan Oneal Procedure Date: 03/15/2022 8:22 AM MRN: [id number] Account #: [id number] Date of Birth: 1950/08/15 Admit Type: Outpatient Age: 73 Room: [facility] ENDO ROOM 3 Gender: Male Note Status: Supervisor Override Instrument Name: Jasper Riling [id number] Procedure:             Colonoscopy Indications:           Personal history of colonic polyps, Chronic diarrhea Providers:             Andrey Farmer MD, MD Referring MD:          Sofie Hartigan (Referring MD) Medicines:             Monitored Anesthesia Care Complications:         No immediate complications. Estimated blood loss:                         Minimal. Procedure:             Pre-Anesthesia Assessment:                        - Prior to the procedure, a History and Physical was                         performed, and patient medications and allergies were                         reviewed. The patient is competent. The risks and                         benefits of the procedure and the sedation options and                         risks were discussed with the patient. All questions                         were answered and informed consent was obtained.                         Patient identification and proposed procedure were                         verified by the physician, the nurse, the                         anesthesiologist, the anesthetist and the technician                         in the endoscopy suite. Mental Status Examination:                         alert and oriented. Airway Examination: normal                         oropharyngeal airway and neck mobility. Respiratory                         Examination: clear to auscultation. CV Examination:  normal. Prophylactic Antibiotics: The patient does not                         require prophylactic antibiotics. Prior                         Anticoagulants: The patient has taken no  anticoagulant                         or antiplatelet agents. ASA Grade Assessment: III - A                         patient with severe systemic disease. After reviewing                         the risks and benefits, the patient was deemed in                         satisfactory condition to undergo the procedure. The                         anesthesia plan was to use monitored anesthesia care                         (MAC). Immediately prior to administration of                         medications, the patient was re-assessed for adequacy                         to receive sedatives. The heart rate, respiratory                         rate, oxygen saturations, blood pressure, adequacy of                         pulmonary ventilation, and response to care were                         monitored throughout the procedure. The physical                         status of the patient was re-assessed after the                         procedure.                        After obtaining informed consent, the colonoscope was                         passed under direct vision. Throughout the procedure,                         the patient's blood pressure, pulse, and oxygen                         saturations were monitored continuously. The  Colonoscope was introduced through the anus and                         advanced to the the terminal ileum. The colonoscopy                         was performed without difficulty. The patient                         tolerated the procedure well. The quality of the bowel                         preparation was good. The terminal ileum, ileocecal                         valve, appendiceal orifice, and rectum were                         photographed. Findings:      The perianal and digital rectal examinations were normal.      The terminal ileum appeared normal.      Two sessile polyps were found in the ascending colon. The polyps were 2        mm in size. These polyps were removed with a jumbo cold forceps.       Resection and retrieval were complete. Estimated blood loss was minimal.      A 5 mm polyp was found in the ascending colon. The polyp was sessile.       The polyp was removed with a cold snare. Resection and retrieval were       complete. Estimated blood loss was minimal.      Scattered small-mouthed diverticula were found in the sigmoid colon,       descending colon and ascending colon.      Normal mucosa was found in the entire colon. Biopsies for histology were       taken with a cold forceps from the entire colon for evaluation of       microscopic colitis. Estimated blood loss was minimal.      Internal hemorrhoids were found during retroflexion. The hemorrhoids       were Grade I (internal hemorrhoids that do not prolapse).      The exam was otherwise without abnormality on direct and retroflexion       views. Impression:            - The examined portion of the ileum was normal.                        - Two 2 mm polyps in the ascending colon, removed with                         a jumbo cold forceps. Resected and retrieved.                        - One 5 mm polyp in the ascending colon, removed with                         a cold snare. Resected and retrieved.                        -  Diverticulosis in the sigmoid colon, in the                         descending colon and in the ascending colon.                        - Normal mucosa in the entire examined colon. Biopsied.                        - Internal hemorrhoids.                        - The examination was otherwise normal on direct and                         retroflexion views. Recommendation:        - Discharge patient to home.                        - Resume previous diet.                        - Continue present medications.                        - Await pathology results.                        - Repeat colonoscopy in 3 - 5 years for  surveillance.                        - Return to referring physician as previously                         scheduled. Procedure Code(s):     --- Professional ---                        [phone], Colonoscopy, flexible; with removal of                         tumor(s), polyp(s), or other lesion(s) by snare                         technique                        45380, 24, Colonoscopy, flexible; with biopsy, single                         or multiple Diagnosis Code(s):     --- Professional ---                        K64.0, First degree hemorrhoids                        D12.2, Benign neoplasm of ascending colon                        K52.9, Noninfective gastroenteritis and colitis,  unspecified                        K57.30, Diverticulosis of large intestine without                         perforation or abscess without bleeding CPT copyright 2022 [employer]. All rights reserved. The codes documented in this report are preliminary and upon coder review may  be revised to meet current compliance requirements. Andrey Farmer MD, MD 03/15/2022 9:05:01 AM Number of Addenda: 0 Note Initiated On: 03/15/2022 8:22 AM Scope Withdrawal Time: 0 hours 14 minutes 53 seconds  Total Procedure Duration: 0 hours 19 minutes 33 seconds  Estimated Blood Loss:  Estimated blood loss was minimal.      [facility]

## 2022-03-15 NOTE — Interval H&P Note (Signed)
History and Physical Interval Note:  03/15/2022 8:32 AM  Jonathan Oneal  has presented today for surgery, with the diagnosis of HX OF ADENOMATOUS POLYP OF COLON,BOWEL HABIT CHANGES, DIARRHEA.  The various methods of treatment have been discussed with the patient and family. After consideration of risks, benefits and other options for treatment, the patient has consented to  Procedure(s): COLONOSCOPY WITH PROPOFOL (N/A) as a surgical intervention.  The patient's history has been reviewed, patient examined, no change in status, stable for surgery.  I have reviewed the patient's chart and labs.  Questions were answered to the patient's satisfaction.     Lesly Rubenstein  Ok to proceed with colonoscopy

## 2022-03-15 NOTE — Transfer of Care (Signed)
Immediate Anesthesia Transfer of Care Note  Patient: Jonathan Oneal  Procedure(s) Performed: COLONOSCOPY WITH PROPOFOL  Patient Location: Endoscopy Unit  Anesthesia Type:General  Level of Consciousness: drowsy  Airway & Oxygen Therapy: Patient Spontanous Breathing  Post-op Assessment: Report given to RN and Post -op Vital signs reviewed and stable  Post vital signs: Reviewed  Last Vitals:  Vitals Value Taken Time  BP 103/58   Temp    Pulse 93 03/15/22 0903  Resp 18 03/15/22 0903  SpO2 99 % 03/15/22 0903  Vitals shown include unvalidated device data.  Last Pain:  Vitals:   03/15/22 0757  TempSrc: Temporal  PainSc: 0-No pain         Complications: No notable events documented.

## 2022-03-18 ENCOUNTER — Encounter: Payer: Self-pay | Admitting: Gastroenterology

## 2022-03-18 LAB — SURGICAL PATHOLOGY

## 2022-04-04 NOTE — Anesthesia Postprocedure Evaluation (Signed)
Anesthesia Post Note  Patient: Jonathan Oneal  Procedure(s) Performed: COLONOSCOPY WITH PROPOFOL  Patient location during evaluation: Endoscopy Anesthesia Type: General Level of consciousness: awake and alert Pain management: pain level controlled Vital Signs Assessment: post-procedure vital signs reviewed and stable Respiratory status: spontaneous breathing, nonlabored ventilation, respiratory function stable and patient connected to nasal cannula oxygen Cardiovascular status: blood pressure returned to baseline and stable Postop Assessment: no apparent nausea or vomiting Anesthetic complications: no   No notable events documented.   Last Vitals:  Vitals:   03/15/22 0912 03/15/22 0922  BP: 136/80   Pulse:  87  Resp:    Temp:    SpO2:  100%    Last Pain:  Vitals:   03/16/22 1212  TempSrc:   PainSc: 0-No pain                 Martha Clan
